# Patient Record
Sex: Female | Born: 2000 | Race: Black or African American | Hispanic: No | Marital: Single | Smoking: Never smoker
Health system: Southern US, Community
[De-identification: ages and names within clinical notes are randomized; demographics above are authoritative.]

## PROBLEM LIST (undated history)

## (undated) DIAGNOSIS — A6000 Herpesviral infection of urogenital system, unspecified: Secondary | ICD-10-CM

## (undated) DIAGNOSIS — F32A Depression, unspecified: Secondary | ICD-10-CM

## (undated) DIAGNOSIS — I10 Essential (primary) hypertension: Secondary | ICD-10-CM

## (undated) DIAGNOSIS — F411 Generalized anxiety disorder: Secondary | ICD-10-CM

## (undated) DIAGNOSIS — J45909 Unspecified asthma, uncomplicated: Secondary | ICD-10-CM

## (undated) DIAGNOSIS — J302 Other seasonal allergic rhinitis: Secondary | ICD-10-CM

## (undated) HISTORY — PX: BREAST REDUCTION SURGERY: SHX8

## (undated) HISTORY — DX: Depression, unspecified: F32.A

## (undated) HISTORY — DX: Herpesviral infection of urogenital system, unspecified: A60.00

## (undated) HISTORY — PX: ABCESS DRAINAGE: SHX399

## (undated) HISTORY — DX: Generalized anxiety disorder: F41.1

---
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
## 2021-03-17 ENCOUNTER — Other Ambulatory Visit: Payer: Self-pay

## 2021-03-17 ENCOUNTER — Emergency Department (HOSPITAL_BASED_OUTPATIENT_CLINIC_OR_DEPARTMENT_OTHER)
Admission: EM | Admit: 2021-03-17 | Discharge: 2021-03-17 | Disposition: A | Discharge status: Home / Self Care | Payer: Medicaid Other | Attending: Emergency Medicine | Admitting: Emergency Medicine

## 2021-03-17 ENCOUNTER — Encounter (HOSPITAL_BASED_OUTPATIENT_CLINIC_OR_DEPARTMENT_OTHER): Payer: Self-pay

## 2021-03-17 DIAGNOSIS — Z5321 Procedure and treatment not carried out due to patient leaving prior to being seen by health care provider: Secondary | ICD-10-CM | POA: Diagnosis not present

## 2021-03-17 DIAGNOSIS — Z20822 Contact with and (suspected) exposure to covid-19: Secondary | ICD-10-CM | POA: Insufficient documentation

## 2021-03-17 DIAGNOSIS — J101 Influenza due to other identified influenza virus with other respiratory manifestations: Secondary | ICD-10-CM | POA: Insufficient documentation

## 2021-03-17 LAB — RESP PANEL BY RT-PCR (FLU A&B, COVID) ARPGX2
Influenza A by PCR: POSITIVE — AB
Influenza B by PCR: NEGATIVE
SARS Coronavirus 2 by RT PCR: NEGATIVE

## 2021-03-17 NOTE — ED Notes (Signed)
Pt called x 1 w/o response.  

## 2021-03-17 NOTE — ED Triage Notes (Signed)
Pt c/o URI sx started last night with +flu exposure-NAD-steady gait

## 2021-03-17 NOTE — ED Notes (Signed)
Pt called x2 w/o relief.

## 2021-05-24 IMAGING — DX DG CHEST 1V PORT
1 series · 1 of 1 positions shown · non-contrast
Comparison: None.

CLINICAL DATA: Shortness of breath and tachycardia

EXAM:
PORTABLE CHEST 1 VIEW

[chest ap]
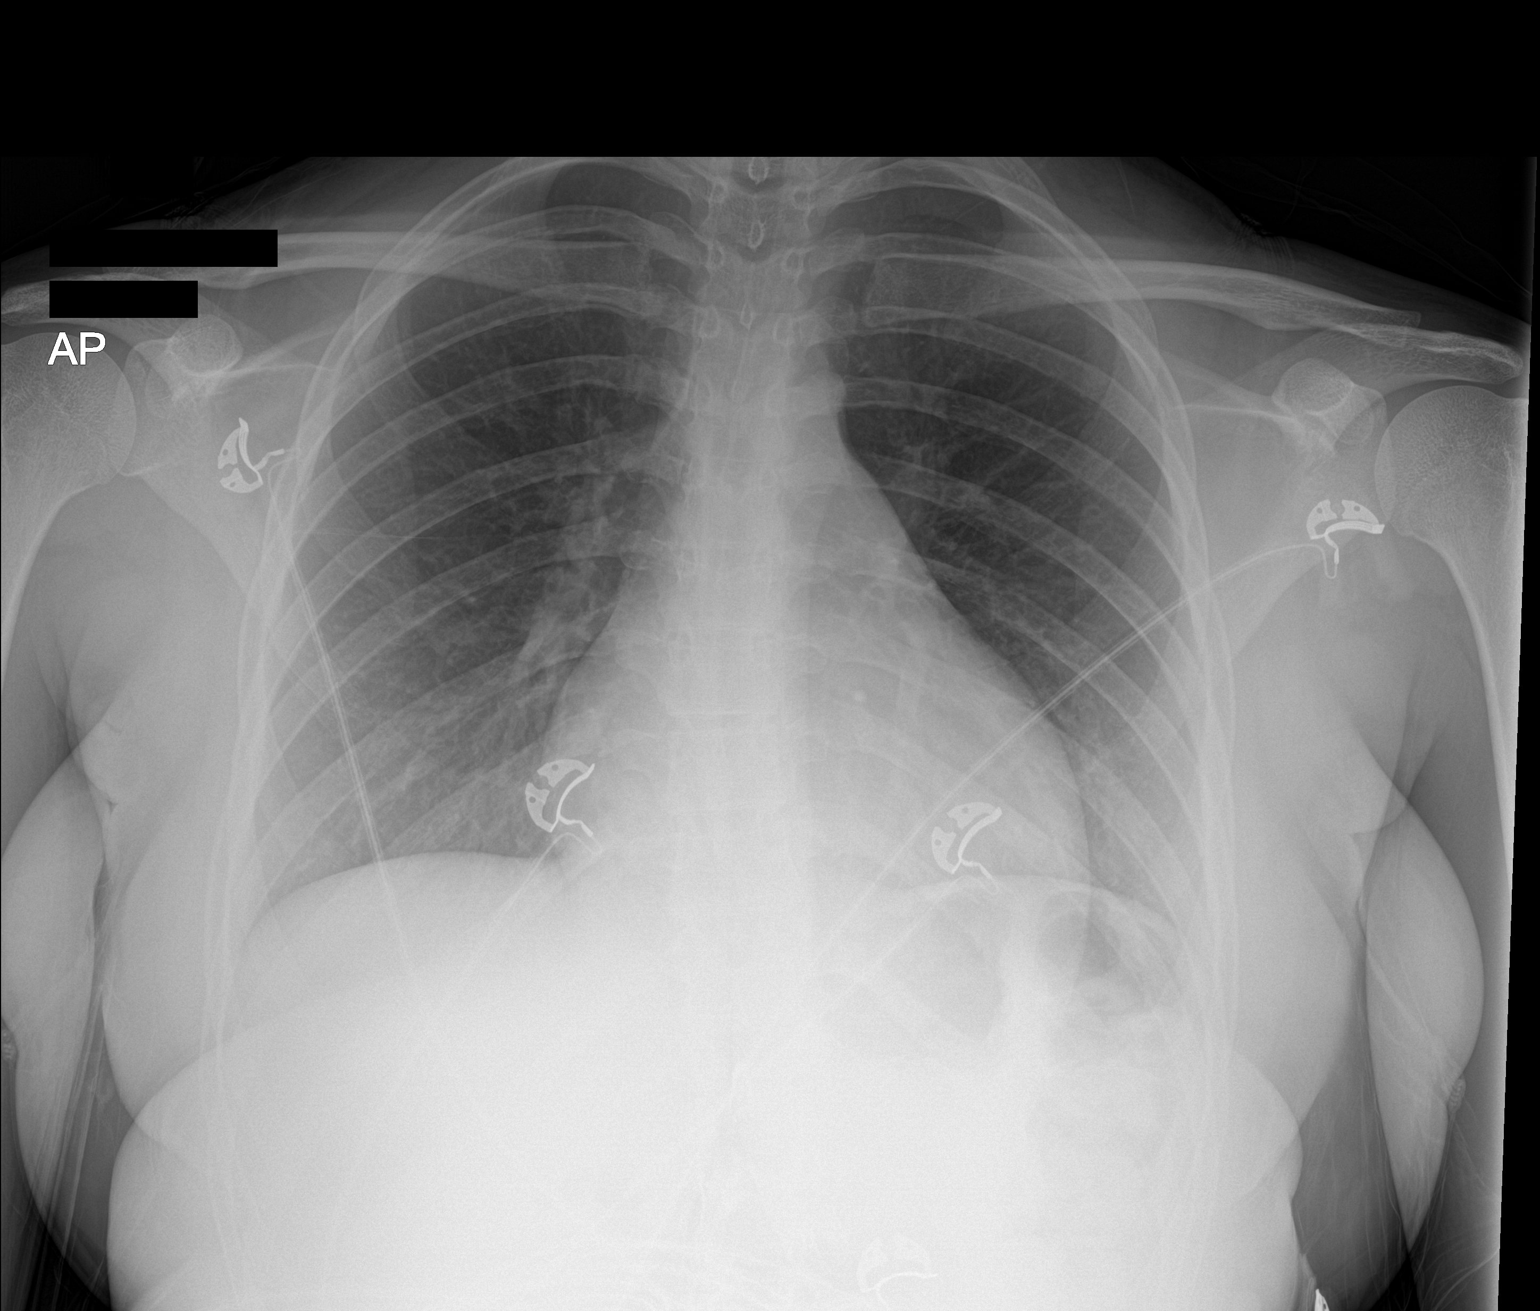

[1 of 1 positions shown; findings below may reference images not displayed]

FINDINGS: The heart size and mediastinal contours are within normal limits.
Both lungs are clear. The visualized skeletal structures are
unremarkable.
IMPRESSION: No active disease.

## 2021-08-07 ENCOUNTER — Other Ambulatory Visit: Payer: Self-pay

## 2021-08-07 ENCOUNTER — Emergency Department (HOSPITAL_BASED_OUTPATIENT_CLINIC_OR_DEPARTMENT_OTHER)
Admission: EM | Admit: 2021-08-07 | Discharge: 2021-08-07 | Disposition: A | Discharge status: Home / Self Care | Payer: Medicaid Other | Attending: Emergency Medicine | Admitting: Emergency Medicine

## 2021-08-07 DIAGNOSIS — H10022 Other mucopurulent conjunctivitis, left eye: Secondary | ICD-10-CM | POA: Diagnosis not present

## 2021-08-07 DIAGNOSIS — H5712 Ocular pain, left eye: Secondary | ICD-10-CM | POA: Diagnosis present

## 2021-08-07 NOTE — ED Provider Notes (Signed)
?[facility] EMERGENCY DEPARTMENT ?Provider Note ? ? ?CSN: [id number] ?Arrival date & time: 08/07/21  2114 ? ?  ? ?History ? ?Chief Complaint  ?Patient presents with  ? Eye Problem  ? ? ?Margaret Harvey is a 21 y.o. female who presents to the ED for evaluation of left eye pain, redness and drainage.  Patient was seen 3 days ago at urgent care for similar symptoms and was prescribed amoxicillin p.o. in addition to Polytrim otic drops she.  She notes that her ear pain has improved, however her eye redness and pain has not.  She notes that it has become increasingly water and blurry.  She describes the pain as achy instead of burning or sharp.  Denies mucopurulent discharge.  No fevers or chills. ? ? ?Eye Problem ? ?  ? ?Home Medications ?Prior to Admission medications   ?Medication Sig Start Date End Date Taking? Authorizing Provider  ?adapalene (DIFFERIN) 0.1 % gel After washing, apply a thin film topically to affected area(s) once daily 09/19/16   [provider]  ?albuterol (PROVENTIL HFA;VENTOLIN HFA) 108 (90 Base) MCG/ACT inhaler Inhale into the lungs. 09/19/16   [provider]  ?cetirizine (ZYRTEC) 10 MG tablet Take 10 mg by mouth. 09/19/16   [provider]  ?diphenhydrAMINE (BENADRYL) 25 MG tablet Take 1 tablet (25 mg total) by mouth every 6 (six) hours as needed. 05/24/19   Mesner, Barbara Cower, MD  ?EPINEPHrine (EPIPEN 2-PAK) 0.3 mg/0.3 mL IJ SOAJ injection Inject 0.3 mLs (0.3 mg total) into the muscle as needed for anaphylaxis. 05/24/19   Mesner, Barbara Cower, MD  ?fluticasone (FLONASE) 50 MCG/ACT nasal spray Place into the nose. 09/19/16   [provider]  ?Olopatadine HCl 0.2 % SOLN Apply to eye. 09/19/16   [provider]  ?promethazine (PHENERGAN) 12.5 MG tablet 1-2 tablets as needed every 6 hours for headache/nausea 11/22/16   Margurite Auerbach, MD  ?   ? ?Allergies    ?Patient has no known allergies.   ? ?Review of Systems   ?Review of Systems ? ?Physical Exam ?Updated Vital  Signs ?BP 132/82 (BP Location: Right Arm)   Pulse 82   Temp 98.4 ?F (36.9 ?C) (Oral)   Resp 18   LMP 07/11/2021   SpO2 99%  ?Physical Exam ?Vitals and nursing note reviewed.  ?Constitutional:   ?   General: She is not in acute distress. ?   Appearance: She is not ill-appearing.  ?HENT:  ?   Head: Atraumatic.  ?Eyes:  ?   Extraocular Movements: Extraocular movements intact.  ?   Conjunctiva/sclera: Conjunctivae normal.  ?   Comments: Left eye with mild episcleral injection without hemorrhage or exudate.  No pain with EOM movement ? ?Visual fields with 2020 on right eye 20/50 on left eye, complaining of blurriness  ?Cardiovascular:  ?   Rate and Rhythm: Normal rate and regular rhythm.  ?   Pulses: Normal pulses.  ?   Heart sounds: No murmur heard. ?Pulmonary:  ?   Effort: Pulmonary effort is normal. No respiratory distress.  ?   Breath sounds: Normal breath sounds.  ?Abdominal:  ?   General: Abdomen is flat. There is no distension.  ?   Palpations: Abdomen is soft.  ?   Tenderness: There is no abdominal tenderness.  ?Musculoskeletal:     ?   General: Normal range of motion.  ?   Cervical back: Normal range of motion.  ?Skin: ?   General: Skin is warm and dry.  ?  Capillary Refill: Capillary refill takes less than 2 seconds.  ?Neurological:  ?   General: No focal deficit present.  ?   Mental Status: She is alert.  ?Psychiatric:     ?   Mood and Affect: Mood normal.  ? ? ?ED Results / Procedures / Treatments   ?Labs ?(all labs ordered are listed, but only abnormal results are displayed) ?Labs Reviewed - No data to display ? ?EKG ?None ? ?Radiology ?No results found. ? ?Procedures ?Procedures  ? ? ?Medications Ordered in ED ?Medications - No data to display ? ?ED Course/ Medical Decision Making/ A&P ?  ?                        ?Medical Decision Making ? ?History:  ?Per HPI ?Social determinants of health: None ? ?Initial impression: ? ?This patient presents to the ED for concern of Left eye pain, this involves an  extensive number of treatment options, and is a complaint that carries with it a high risk of complications and morbidity.   The emergent differential diagnosis for acute eye pain includes, but is not limited to ocular ischemia, optic neuritis, temporal arteritis, Sinusitis, neuralgia, Migraine, Acute angle closure glaucoma, eye trauma, uveitis, iritis, corneal abrasion/ulceration, and photokeratitis. ? ? ?ED Course: ?21 year old female with persistent left eye redness, wateriness and blurriness despite Polytrim eyedrops and systemic amoxicillin for 2 days.  On physical exam, patient is not having pain with EOM movement.  No periorbital swelling or edema that would be concerning for orbital cellulitis.  No murky purulent discharge that would suggest bacterial conjunctivitis.  Since this is affecting only 1 eye, additionally less concerned for viral or allergic conjunctivitis.  Patient had decreased visual acuity of the left eye.  Discussed my case with Dr. Stevie Kern who recommends that patient follow-up with ophthalmology as she is already on both systemic and topical antibiotics. ? ?Disposition: ? ?After consideration of the diagnostic results, physical exam, history and the patients response to treatment feel that the patent would benefit from discharge with ophthalmology follow-up.   ?Other conjunctivitis of left eye: Patient encouraged to continue with her antibiotics and to call ophthalmology office in the morning.  Referral was provided.  Restrict return precautions were discussed.  All questions were asked and answered and she was discharged home in good condition. ? ? ? ?Final Clinical Impression(s) / ED Diagnoses ?Final diagnoses:  ?Other mucopurulent conjunctivitis of left eye  ? ? ?Rx / DC Orders ?ED Discharge Orders   ? ?      Ordered  ?  Visual acuity screening       ? 08/07/21 2147  ? ?  ?  ? ?  ? ? ?  ?Janell Quiet, [place] ?08/08/21 0014 ? ?  ?Milagros Loll, MD ?08/10/21 1324 ? ?

## 2021-08-07 NOTE — ED Triage Notes (Signed)
Pt presents with L eye pain and drainage. Was seen at [facility] already on Thurs and given antibiotic drops. She says that it is not getting better and vision is blurry. R eye is at baseline.  ?

## 2021-08-07 NOTE — Discharge Instructions (Addendum)
I have given you a referral to an ophthalmologist that I want you to call in the morning to schedule an appointment with.  Continue taking the amoxicillin and the antibiotic eyedrops as prescribed.  I would also refrain from using redness relieving eyedrops as this can cause further irritation.  You can try lubricating drops such as Systane or refresh that you can get over-the-counter and use this several times throughout the day to help with pain and irritation.  You are on the correct medications for your symptoms, and at this point you may need to be evaluated by an eye doctor if your symptoms or not improving. ?

## 2021-08-17 ENCOUNTER — Emergency Department (HOSPITAL_BASED_OUTPATIENT_CLINIC_OR_DEPARTMENT_OTHER)
Admission: EM | Admit: 2021-08-17 | Discharge: 2021-08-18 | Disposition: A | Discharge status: Home / Self Care | Payer: Medicaid Other | Attending: Emergency Medicine | Admitting: Emergency Medicine

## 2021-08-17 ENCOUNTER — Encounter (HOSPITAL_BASED_OUTPATIENT_CLINIC_OR_DEPARTMENT_OTHER): Payer: Self-pay

## 2021-08-17 ENCOUNTER — Other Ambulatory Visit: Payer: Self-pay

## 2021-08-17 DIAGNOSIS — N926 Irregular menstruation, unspecified: Secondary | ICD-10-CM | POA: Insufficient documentation

## 2021-08-17 LAB — PREGNANCY, URINE: Preg Test, Ur: NEGATIVE

## 2021-08-17 NOTE — ED Triage Notes (Signed)
Pt arrives with c/o a late menstrual cycle by 6 days. Per pt, she has taken a pregnancy test, but they have been negative. Pt endorse cramps and nausea.   ?

## 2021-08-18 NOTE — ED Provider Notes (Signed)
?[facility] EMERGENCY DEPARTMENT ?Provider Note ? ? ?CSN: [id number] ?Arrival date & time: 08/17/21  2338 ? ?  ? ?History ? ?Chief Complaint  ?Patient presents with  ? Possible Pregnancy  ? ? ?Margaret Harvey is a 21 y.o. female. ? ?Patient is a 21 year old female presenting with complaints of a missed menstrual period.  She was due to have her period 10 days ago, however it never came.  Patient is denying any abdominal pain, discharge, fever, or other complaint. ? ?The history is provided by the patient.  ? ?  ? ?Home Medications ?Prior to Admission medications   ?Medication Sig Start Date End Date Taking? Authorizing Provider  ?adapalene (DIFFERIN) 0.1 % gel After washing, apply a thin film topically to affected area(s) once daily 09/19/16   [provider]  ?albuterol (PROVENTIL HFA;VENTOLIN HFA) 108 (90 Base) MCG/ACT inhaler Inhale into the lungs. 09/19/16   [provider]  ?cetirizine (ZYRTEC) 10 MG tablet Take 10 mg by mouth. 09/19/16   [provider]  ?diphenhydrAMINE (BENADRYL) 25 MG tablet Take 1 tablet (25 mg total) by mouth every 6 (six) hours as needed. 05/24/19   Mesner, Barbara Cower, MD  ?EPINEPHrine (EPIPEN 2-PAK) 0.3 mg/0.3 mL IJ SOAJ injection Inject 0.3 mLs (0.3 mg total) into the muscle as needed for anaphylaxis. 05/24/19   Mesner, Barbara Cower, MD  ?fluticasone (FLONASE) 50 MCG/ACT nasal spray Place into the nose. 09/19/16   [provider]  ?Olopatadine HCl 0.2 % SOLN Apply to eye. 09/19/16   [provider]  ?promethazine (PHENERGAN) 12.5 MG tablet 1-2 tablets as needed every 6 hours for headache/nausea 11/22/16   Margurite Auerbach, MD  ?   ? ?Allergies    ?Patient has no known allergies.   ? ?Review of Systems   ?Review of Systems  ?All other systems reviewed and are negative. ? ?Physical Exam ?Updated Vital Signs ?BP (!) 141/92 (BP Location: Left Arm)   Pulse 93   Temp 98 ?F (36.7 ?C)   Resp 16   Wt 63.5 kg   LMP  (LMP Unknown)   SpO2 100%   BMI 26.45 kg/m?   ?Physical Exam ?Vitals and nursing note reviewed.  ?Constitutional:   ?   Appearance: Normal appearance.  ?HENT:  ?   Head: Normocephalic and atraumatic.  ?Pulmonary:  ?   Effort: Pulmonary effort is normal.  ?Abdominal:  ?   General: Abdomen is flat. There is no distension.  ?   Tenderness: There is no abdominal tenderness. There is no right CVA tenderness, left CVA tenderness, guarding or rebound.  ?Skin: ?   General: Skin is warm and dry.  ?Neurological:  ?   Mental Status: She is alert.  ? ? ?ED Results / Procedures / Treatments   ?Labs ?(all labs ordered are listed, but only abnormal results are displayed) ?Labs Reviewed  ?PREGNANCY, URINE  ? ? ?EKG ?None ? ?Radiology ?No results found. ? ?Procedures ?Procedures  ? ? ?Medications Ordered in ED ?Medications - No data to display ? ?ED Course/ Medical Decision Making/ A&P ? ?Pregnancy test is negative.  Patient's abdominal exam is benign.  She has no other complaints.  I see no indication for any work-up.  Patient is to follow-up with her primary doctor/GYN if her period does not come soon.  To return as needed for any problems. ? ?Final Clinical Impression(s) / ED Diagnoses ?Final diagnoses:  ?None  ? ? ?Rx / DC Orders ?ED Discharge Orders   ? ? None  ? ?  ? ? ?  ?  Geoffery Lyons, MD ?08/18/21 0009 ? ?

## 2021-08-18 NOTE — Discharge Instructions (Signed)
Follow-up with your GYN/primary doctor if you experience additional issues. ?

## 2021-12-17 IMAGING — DX DG CHEST 1V PORT
1 series · 1 of 1 positions shown · non-contrast
Comparison: None.

CLINICAL DATA: [id number]

EXAM:
PORTABLE CHEST 1 VIEW

[chest ap]
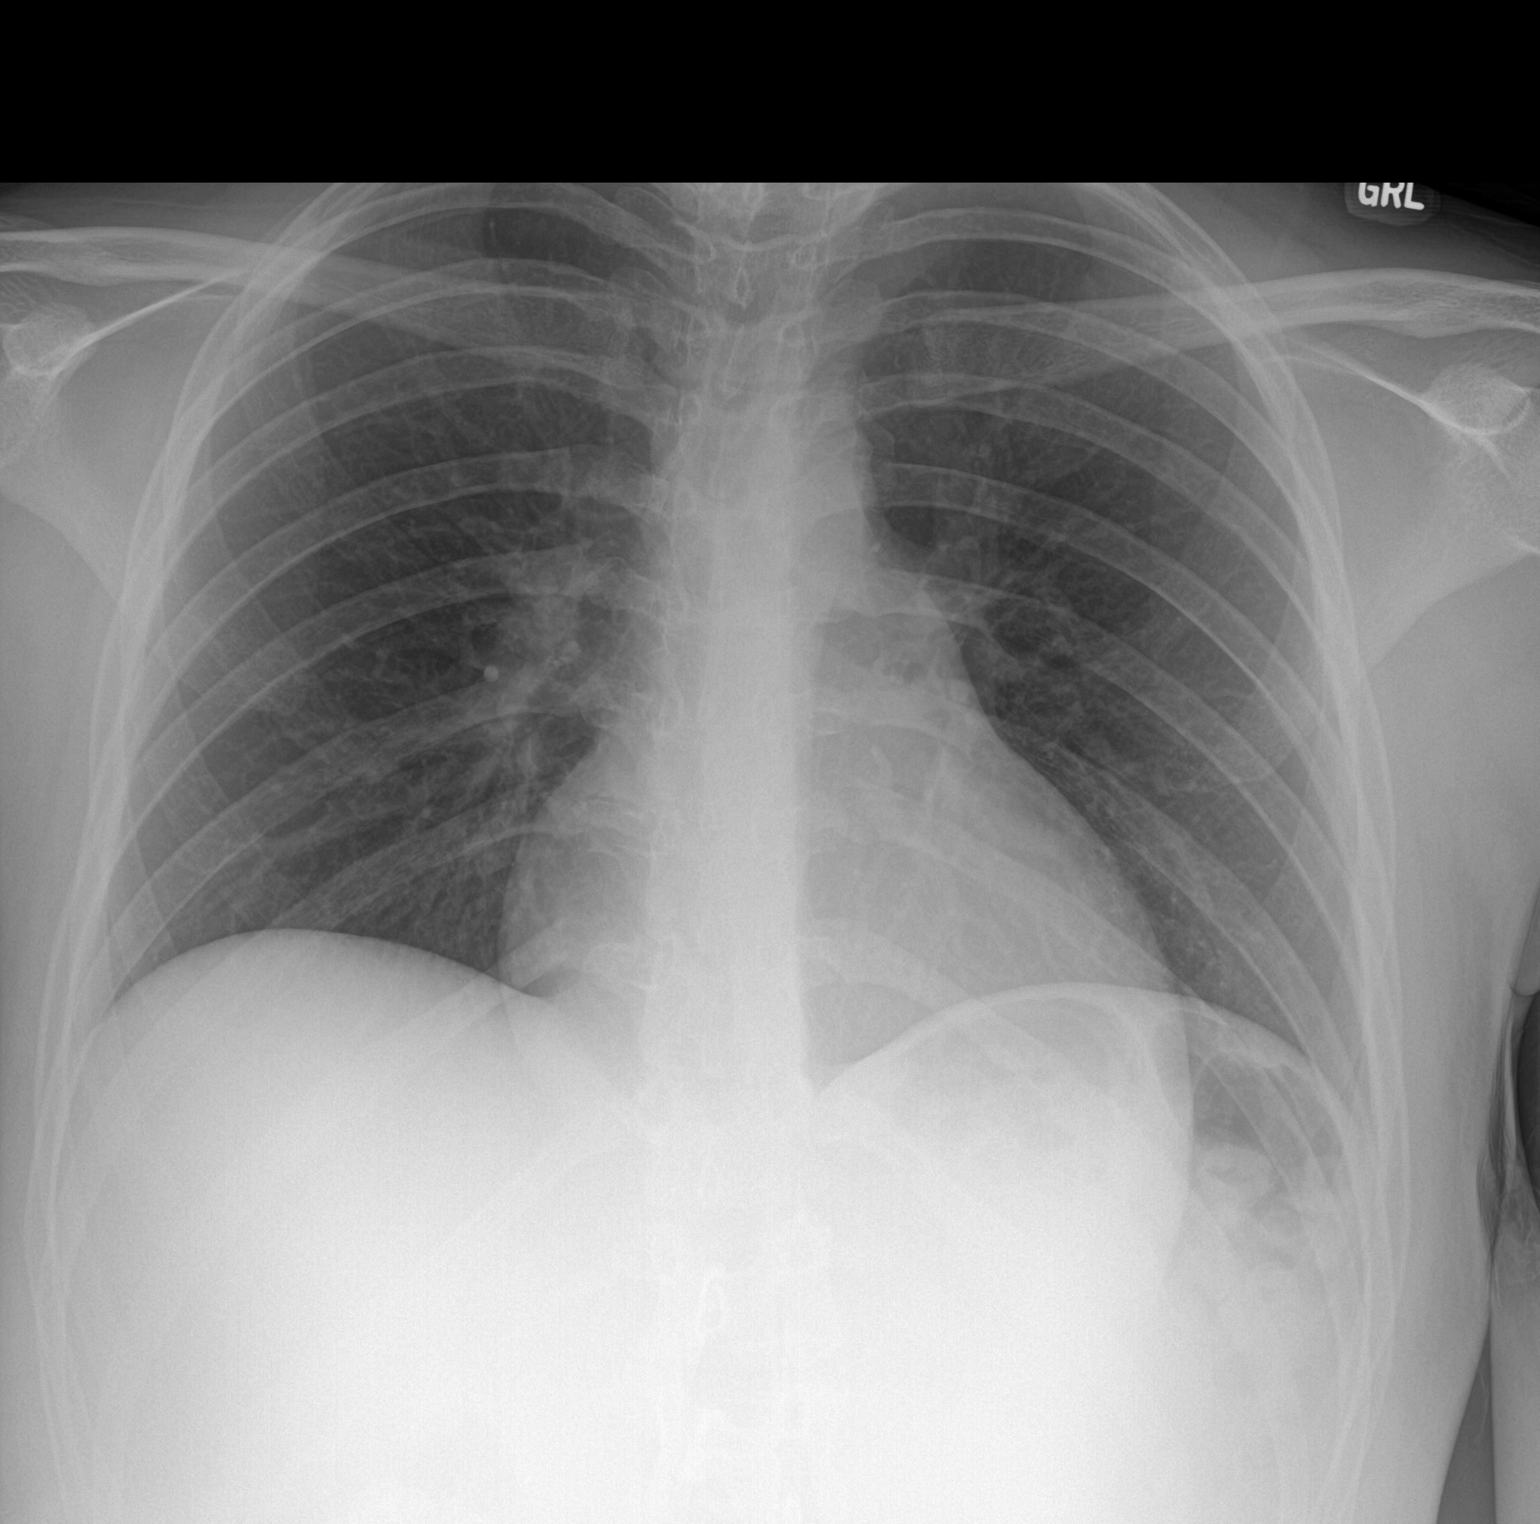

[1 of 1 positions shown; findings below may reference images not displayed]

FINDINGS: The heart size and mediastinal contours are within normal limits.
Both lungs are clear. The visualized skeletal structures are
unremarkable.
IMPRESSION: No active disease.

## 2022-04-03 DIAGNOSIS — A6004 Herpesviral vulvovaginitis: Secondary | ICD-10-CM | POA: Insufficient documentation

## 2022-05-15 HISTORY — PX: DILATION AND EVACUATION: SHX1459

## 2022-05-28 DIAGNOSIS — F411 Generalized anxiety disorder: Secondary | ICD-10-CM | POA: Insufficient documentation

## 2022-05-28 DIAGNOSIS — F332 Major depressive disorder, recurrent severe without psychotic features: Secondary | ICD-10-CM | POA: Insufficient documentation

## 2022-08-16 ENCOUNTER — Encounter: Payer: Medicaid Other | Admitting: Advanced Practice Midwife

## 2022-08-16 NOTE — Progress Notes (Deleted)
   Subjective:     Margaret Harvey is a 22 y.o. female here at [facility] *** for a routine exam.  Current complaints: ***.  Personal health questionnaire reviewed: {yes/no:9010}.  Do you have a primary care provider? *** Do you feel safe at home? ***    Health Maintenance Due  Topic Date Due   COVID-19 Vaccine (1) Never done   HPV VACCINES (1 - 2-dose series) Never done   HIV Screening  Never done   Hepatitis C Screening  Never done   DTaP/Tdap/Td (1 - Tdap) Never done   PAP-Cervical Cytology Screening  Never done   PAP SMEAR-Modifier  Never done     Risk factors for chronic health problems: Smoking: Alchohol/how much: Pt BMI: There is no height or weight on file to calculate BMI.   Gynecologic History No LMP recorded. Contraception: {method:5051} Last Pap: ***. Results were: {norm/abn:16337} Last mammogram: ***. Results were: {norm/abn:16337}  Obstetric History OB History  No obstetric history on file.     {Common ambulatory SmartLinks:19316}  Review of Systems {ros; complete:30496}    Objective:   VS reviewed, nursing note reviewed,  Constitutional: well developed, well nourished, no distress HEENT: normocephalic, thyroid without enlargement or mass HEART: RRR, no murmurs rubs/gallops RESP: clear and equal to auscultation bilaterally in all lobes  Breast Exam:  ***Deferred with low risks and shared decision making, discussed recommendation to start mammogram between 40-50 yo/ exam performed: right breast normal without mass, skin or nipple changes or axillary nodes, left breast normal without mass, skin or nipple changes or axillary nodes Abdomen: soft Neuro: alert and oriented x 3 Skin: warm, dry Psych: affect normal Pelvic exam: ***Deferred/ Performed: Cervix pink, visually closed, without lesion, scant white creamy discharge, vaginal walls and external genitalia normal Bimanual exam: Cervix 0/long/high, firm, anterior, neg CMT, uterus nontender, nonenlarged,  adnexa without tenderness, enlargement, or mass        Assessment/Plan:   There are no diagnoses linked to this encounter.     No follow-ups on file.   Fatima Blank, CNM 8:28 AM

## 2022-08-23 ENCOUNTER — Encounter: Payer: Self-pay | Admitting: Obstetrics and Gynecology

## 2022-08-23 ENCOUNTER — Ambulatory Visit: Payer: Medicaid Other | Admitting: Obstetrics and Gynecology

## 2022-08-23 VITALS — BP 124/81 | HR 66 | Ht 61.0 in | Wt 167.0 lb

## 2022-08-23 DIAGNOSIS — Z3009 Encounter for other general counseling and advice on contraception: Secondary | ICD-10-CM

## 2022-08-23 DIAGNOSIS — Z23 Encounter for immunization: Secondary | ICD-10-CM | POA: Diagnosis not present

## 2022-08-23 DIAGNOSIS — Z7689 Persons encountering health services in other specified circumstances: Secondary | ICD-10-CM

## 2022-08-23 LAB — CYTOLOGY - PAP

## 2022-08-23 LAB — GC/CHLAMYDIA PROBE AMP
Chlamydia, DNA Probe: NEGATIVE
Neisseria Gonorrhea: NEGATIVE

## 2022-08-23 LAB — RPR: RPR: NONREACTIVE

## 2022-08-23 LAB — HCV ANTIBODY: HCV Ab: NONREACTIVE

## 2022-08-23 LAB — HEPATITIS B SURFACE ANTIGEN: HBsAg Screen: NONREACTIVE

## 2022-08-23 LAB — HIV 1/2 AB - SCREENING TEST - 4TH GEN: HIV: NONREACTIVE

## 2022-08-23 NOTE — Patient Instructions (Signed)
It was nice meeting you today! You are up to date on your Ob/Gyn care. Please let us know if you have any questions.

## 2022-08-23 NOTE — Progress Notes (Signed)
NEW GYNECOLOGY VISIT  Subjective:  Margaret Harvey is a 22 y.o. G1P0010 with LMP 08/16/22 presenting to establish care.  Underwent a surgical pregnancy termination in January. Per patient, procedure was uncomplicated. She had minimal bleeding after the procedure. Her menstrual cycles returned 8 weeks after the procedure and have been normal. She has no other complaints or concerns today.   She is currently sexually active with female partner is does not use contraception. She is not interested in pregnancy in the near future. She has tried the pill, Depo & IUD but did not like any of them.   Pt requested that her chart is marked confidential as she has a family member that works within our system.  I personally reviewed the following: 04/24/22: Pap NILM 04/24/22: GC/CT negative 03/23/22 HIV NR 03/23/22 HCV NR 03/23/22 RPR NR 03/23/22 HBSAg NR  Past Medical History:  Diagnosis Date   Genital herpes    Seasonal allergies    Past Surgical History:  Procedure Laterality Date   ABCESS DRAINAGE     BREAST REDUCTION SURGERY     DILATION AND EVACUATION  05/2022   Current Outpatient Medications on File Prior to Visit  Medication Sig Dispense Refill   cetirizine (ZYRTEC) 10 MG tablet Take 10 mg by mouth.     diphenhydrAMINE (BENADRYL) 25 MG tablet Take 1 tablet (25 mg total) by mouth every 6 (six) hours as needed. 30 tablet 0   EPINEPHrine (EPIPEN 2-PAK) 0.3 mg/0.3 mL IJ SOAJ injection Inject 0.3 mLs (0.3 mg total) into the muscle as needed for anaphylaxis. 1 each 1   fluticasone (FLONASE) 50 MCG/ACT nasal spray Place into the nose.     Olopatadine HCl 0.2 % SOLN Apply to eye.     No current facility-administered medications on file prior to visit.   Allergies  Allergen Reactions   Other Anaphylaxis and Other (See Comments)    Almonds   OB History     Gravida  1   Para      Term      Preterm      AB  1   Living         SAB  0   IAB  1   Ectopic      Multiple       Live Births             Social History   Socioeconomic History   Marital status: Single    Spouse name: Not on file   Number of children: Not on file   Years of education: Not on file   Highest education level: Not on file  Occupational History   Not on file  Tobacco Use   Smoking status: Never   Smokeless tobacco: Never  Vaping Use   Vaping Use: Some days  Substance and Sexual Activity   Alcohol use: Yes    Comment: occ   Drug use: Yes    Types: Marijuana   Sexual activity: Yes    Partners: Male    Birth control/protection: None  Other Topics Concern   Not on file  Social History Narrative   Margaret Harvey will be entering the 11th grade at [employer]; she does well in school. She lives with her mother and sister. She enjoys watching TV, dancing, and running.       No therapy or counseling.       Head injury: January hit head on the side of the brick house.    Social  Determinants of Health   Financial Resource Strain: Not on file  Food Insecurity: Not on file  Transportation Needs: Not on file  Physical Activity: Not on file  Stress: Not on file  Social Connections: Not on file  Intimate Partner Violence: Not on file   Objective:   Vitals:   08/23/22 0903  BP: 124/81  Pulse: 66  Weight: 167 lb (75.8 kg)  Height: 5\' 1"  (1.549 m)   General:  Alert, oriented and cooperative. Patient is in no acute distress.  Skin: Skin is warm and dry. No rash noted.   Cardiovascular: Normal heart rate noted  Respiratory: Normal respiratory effort, no problems with respiration noted  Abdomen: Soft, non-tender, non-distended    Assessment and Plan:  Margaret Harvey is a 22 y.o. presenting to establish care  Encounter to establish care - Pt is currently up to date on screening (pap, GC/CT, HCV, & HIV) and denies any new partners - She is having normal menstrual cycles so reassurance provided regarding completion of surgical termination. Offered UPT for confirmation but she  declines and feels comfortable with plan. - We discussed that she can reach out with new questions/concerns. Recommend annual exam within 1 year  2. Encounter for general counseling and advice on contraceptive management Not currently using contraception. Discussed non-Rx options like condoms, withdrawal, rhythm/timing cycles, and Plan B prn  3. Need for vaccination  Records reviewed and updated in our system. Has received Gardasil x 3 doses. Discussed that she is due for covid and Tdap vaccine. She is considering.  Return in about 1 year (around 08/23/2023) for annual exam.  No future appointments.  Lennart Pall, MD

## 2022-11-08 ENCOUNTER — Emergency Department (HOSPITAL_BASED_OUTPATIENT_CLINIC_OR_DEPARTMENT_OTHER): Payer: Medicaid Other

## 2022-11-08 ENCOUNTER — Emergency Department (HOSPITAL_BASED_OUTPATIENT_CLINIC_OR_DEPARTMENT_OTHER)
Admission: EM | Admit: 2022-11-08 | Discharge: 2022-11-08 | Disposition: A | Discharge status: Home / Self Care | Payer: Medicaid Other | Attending: Emergency Medicine | Admitting: Emergency Medicine

## 2022-11-08 ENCOUNTER — Encounter (HOSPITAL_BASED_OUTPATIENT_CLINIC_OR_DEPARTMENT_OTHER): Payer: Self-pay | Admitting: Urology

## 2022-11-08 DIAGNOSIS — N83202 Unspecified ovarian cyst, left side: Secondary | ICD-10-CM | POA: Diagnosis not present

## 2022-11-08 DIAGNOSIS — R102 Pelvic and perineal pain: Secondary | ICD-10-CM | POA: Diagnosis present

## 2022-11-08 LAB — URINALYSIS, ROUTINE W REFLEX MICROSCOPIC
Bilirubin Urine: NEGATIVE
Glucose, UA: NEGATIVE mg/dL
Hgb urine dipstick: NEGATIVE
Ketones, ur: NEGATIVE mg/dL
Leukocytes,Ua: NEGATIVE
Nitrite: NEGATIVE
Protein, ur: NEGATIVE mg/dL
Specific Gravity, Urine: 1.02 (ref 1.005–1.030)
pH: 7.5 (ref 5.0–8.0)

## 2022-11-08 LAB — PREGNANCY, URINE: Preg Test, Ur: NEGATIVE

## 2022-11-08 NOTE — ED Provider Notes (Signed)
[facility] Provider Note   CSN: [id number] Arrival date & time: 11/08/22  1215     History  Chief Complaint  Patient presents with   Pelvic Pain   Possible Pregnancy    Margaret Harvey is a 22 y.o. female.   Pelvic Pain  Possible Pregnancy   22 year old female presents emergency department with complaints of pelvic pain for the past 4 to 5 days.  Patient states that she is concerned about pregnancy as her menstrual cycle is 1 week late.  Patient states that the end of her last menstrual cycle was around 5/30.  Patient reports pain in uterine area as well as just left of uterine.  Describes pain as cramping type sensation.  States the pain has been responding well to Tylenol in the outpatient setting.  Denies any fever, chills, nausea, vomiting, urinary symptoms, vaginal symptoms, change in bowel habits.  Presents emergency department due to concerns for pregnancy despite 2 negative at-home pregnancy test.  Patient with G1, P0, A1  Past medical history significant for seasonal allergies, genital herpes  Home Medications Prior to Admission medications   Medication Sig Start Date End Date Taking? Authorizing Provider  cetirizine (ZYRTEC) 10 MG tablet Take 10 mg by mouth. 09/19/16   [provider]  diphenhydrAMINE (BENADRYL) 25 MG tablet Take 1 tablet (25 mg total) by mouth every 6 (six) hours as needed. 05/24/19   Mesner, Barbara Cower, MD  EPINEPHrine (EPIPEN 2-PAK) 0.3 mg/0.3 mL IJ SOAJ injection Inject 0.3 mLs (0.3 mg total) into the muscle as needed for anaphylaxis. 05/24/19   Mesner, Barbara Cower, MD  fluticasone (FLONASE) 50 MCG/ACT nasal spray Place into the nose. 09/19/16   [provider]  Olopatadine HCl 0.2 % SOLN Apply to eye. 09/19/16   [provider]      Allergies    Other    Review of Systems   Review of Systems  Genitourinary:  Positive for pelvic pain.  All other systems reviewed and are negative.   Physical  Exam Updated Vital Signs BP 139/85 (BP Location: Right Arm)   Pulse 74   Temp 98 F (36.7 C) (Oral)   Resp 16   Ht 5\' 1"  (1.549 m)   Wt 75.8 kg   LMP 10/04/2022   SpO2 100%   BMI 31.57 kg/m  Physical Exam Vitals and nursing note reviewed.  Constitutional:      General: She is not in acute distress.    Appearance: She is well-developed.  HENT:     Head: Normocephalic and atraumatic.  Eyes:     Conjunctiva/sclera: Conjunctivae normal.  Cardiovascular:     Rate and Rhythm: Normal rate and regular rhythm.     Heart sounds: No murmur heard. Pulmonary:     Effort: Pulmonary effort is normal. No respiratory distress.     Breath sounds: Normal breath sounds.  Abdominal:     Palpations: Abdomen is soft.     Tenderness: There is abdominal tenderness. There is no right CVA tenderness, left CVA tenderness, guarding or rebound. Negative signs include Murphy's sign and McBurney's sign.     Comments: Mild suprapubic tenderness as well as appreciable left-sided adnexal tenderness.  Musculoskeletal:        General: No swelling.     Cervical back: Neck supple.  Skin:    General: Skin is warm and dry.     Capillary Refill: Capillary refill takes less than 2 seconds.  Neurological:     Mental  Status: She is alert.  Psychiatric:        Mood and Affect: Mood normal.     ED Results / Procedures / Treatments   Labs (all labs ordered are listed, but only abnormal results are displayed) Labs Reviewed  URINALYSIS, ROUTINE W REFLEX MICROSCOPIC - Abnormal; Notable for the following components:      Result Value   Color, Urine STRAW (*)    All other components within normal limits  PREGNANCY, URINE    EKG None  Radiology US PELVIC COMPLETE W TRANSVAGINAL AND TORSION R/O  Result Date: 11/08/2022 CLINICAL DATA:  Pain. EXAM: TRANSABDOMINAL AND TRANSVAGINAL ULTRASOUND OF PELVIS DOPPLER ULTRASOUND OF OVARIES TECHNIQUE: Both transabdominal and transvaginal ultrasound examinations of the  pelvis were performed. Transabdominal technique was performed for global imaging of the pelvis including uterus, ovaries, adnexal regions, and pelvic cul-de-sac. It was necessary to proceed with endovaginal exam following the transabdominal exam to visualize the adnexa and endometrium. Color and duplex Doppler ultrasound was utilized to evaluate blood flow to the ovaries. COMPARISON:  None Available. FINDINGS: Uterus Measurements: 7.7 x 3.9 x 5.0 cm. = volume: 78.5 mL. No fibroids or other mass visualized. Endometrium Thickness: 4 mm.  No focal abnormality visualized. Right ovary Measurements: 4.2 x 2.1 x 1.8 cm = volume: 8.5 mL. Normal appearance/no adnexal mass. Left ovary Measurements: 5.3 x 3.4 x 4.7 cm = volume: 45 mL. Associated complex cystic area identified in the ovary measuring 4.1 x 2.7 x 4.3 cm. Possible hemorrhagic. Preserved blood flow to left ovary on Doppler. Pulsed Doppler evaluation of both ovaries demonstrates normal low-resistance arterial and venous waveforms. Other findings No abnormal free fluid. IMPRESSION: 4.3 cm complex left ovarian cystic lesion. Possibly hemorrhagic cyst but indeterminate. Recommend follow-up US in 3-6 months to confirm resolution. Note: This recommendation does not apply to premenarchal patients or to those with increased risk (genetic, family history, elevated tumor markers or other high-risk factors) of ovarian cancer. Reference: Radiology 2019 Nov; 293(2):359-371. Electronically Signed   By: Karen Kays M.D.   On: 11/08/2022 15:36    Procedures Procedures    Medications Ordered in ED Medications - No data to display  ED Course/ Medical Decision Making/ A&P Clinical Course as of 11/08/22 1722  Wed Nov 08, 2022  1355 5/30 lmp Cramping suprabupic pain 4 days [CR]    Clinical Course User Index [CR] Peter Garter, PA                             Medical Decision Making Amount and/or Complexity of Data Reviewed Labs: ordered. Radiology:  ordered.   This patient presents to the ED for concern of pelvic pain, this involves an extensive number of treatment options, and is a complaint that carries with it a high risk of complications and morbidity.  The differential diagnosis includes ectopic pregnancy, ovarian torsion, PID, tubo-ovarian abscess, cystitis, ovarian cyst, menstrual cycle   Co morbidities that complicate the patient evaluation  See HPI   Additional history obtained:  Additional history obtained from EMR External records from outside source obtained and reviewed including hospital records   Lab Tests:  I Ordered, and personally interpreted labs.  The pertinent results include: Your pregnancy negative.  UA without abnormality   Imaging Studies ordered:  I ordered imaging studies including pelvic ultrasound I independently visualized and interpreted imaging which showed complex appearing left-sided adnexal ovarian cyst measuring 4.3 cm most likely hemorrhagic I agree with  the radiologist interpretation   Cardiac Monitoring: / EKG:  The patient was maintained on a cardiac monitor.  I personally viewed and interpreted the cardiac monitored which showed an underlying rhythm of: Sinus rhythm   Consultations Obtained:  N/a   Problem List / ED Course / Critical interventions / Medication management  Left ovarian cyst Reevaluation of the patient showed that the patient stayed the same I have reviewed the patients home medicines and have made adjustments as needed   Social Determinants of Health:  Denies tobacco, illicit drug use   Test / Admission - Considered:  Left ovarian cyst Vitals signs  within normal range and stable throughout visit. Laboratory/imaging studies significant for: See above 22 year old female presents emergency department with complaints of pelvic pain for the past 3 to 4 days.  Patient's pain on exam more left adnexal than midline and suprapubic region so subsequently  ultrasound was obtained for ovarian torsion rule out.  Patient found with evidence of left-sided ovarian cyst most likely hemorrhagic in nature most likely causing patient's symptoms of pelvic pain lateralized to the left.  No evidence of ovarian torsion on ultrasound exam.  Will recommend continued treatment of pain at home with Tylenol/Motrin as well as follow-up in the outpatient setting with OB/GYN for reassessment.  Treatment plan discussed at length with patient and she acknowledged understanding and was agreeable to said plan.  Patient overall well-appearing, febrile no acute distress. Worrisome signs and symptoms were discussed with the patient, and the patient acknowledged understanding to return to the ED if noticed. Patient was stable upon discharge.          Final Clinical Impression(s) / ED Diagnoses Final diagnoses:  Left ovarian cyst    Rx / DC Orders ED Discharge Orders     None         Peter Garter, PA 11/08/22 1722    Virgina Norfolk, DO 11/09/22 [phone]

## 2022-11-08 NOTE — Discharge Instructions (Signed)
As discussed, you do have evidence of left-sided ovarian cyst most likely causing your pain.  Continue take Tylenol/Motrin as needed for pain.  Recommend follow-up with OB/GYN for reassessment of your symptoms/following of said cyst.  Please do not hesitate to return to emergency department for worrisome signs and symptoms we discussed become apparent.

## 2022-11-08 NOTE — ED Triage Notes (Signed)
Pt states pelvic pain x 1 week and states period is a week late  Had abortion in January  Denies any vaginal discharge.  States pain with urination  G-1, P-0,A-1

## 2022-11-09 ENCOUNTER — Ambulatory Visit: Payer: Medicaid Other | Admitting: Obstetrics and Gynecology

## 2022-11-09 DIAGNOSIS — R109 Unspecified abdominal pain: Secondary | ICD-10-CM

## 2022-11-30 ENCOUNTER — Ambulatory Visit: Payer: Medicaid Other | Admitting: Obstetrics and Gynecology

## 2022-11-30 ENCOUNTER — Encounter: Payer: Self-pay | Admitting: Obstetrics and Gynecology

## 2022-11-30 VITALS — BP 129/81 | HR 92 | Resp 16 | Ht 61.0 in | Wt 163.0 lb

## 2022-11-30 DIAGNOSIS — N83202 Unspecified ovarian cyst, left side: Secondary | ICD-10-CM

## 2022-11-30 NOTE — Progress Notes (Signed)
   RETURN GYNECOLOGY VISIT  Subjective:  Margaret Harvey is a 22 y.o. G1P0010 with LMP 11/13/22 presenting for ED follow up of ovarian cyst  Went to ED on 6/26 with 4-5d of LLQ/suprapubic cramping pelvic pain and late period. UPT negative, w/u revealed indeterminate 4cm L adnexal complex cystic lesion. She was recommended to follow up as outpatient.   Today, pt reports resolution of her pain. Got her period a few days later. No other complaints.   I personally reviewed the following: - ED note by Dr. Dicky Doe PA on 11/08/22 - UA 11/08/22 negative LE/nitrite - UPT 11/08/22 negative - Pelvic US 11/08/22 4.1 x 2.7 x 4.3cm complex cystic area in the ovary, possible hemorrhagic cyst with preserved blood flow to ovary on Doppler. Recommended f/u US in 3-6 months  Objective:   Vitals:   11/30/22 1603  BP: 129/81  Pulse: 92  Resp: 16  Weight: 163 lb (73.9 kg)  Height: 5\' 1"  (1.549 m)    General:  Alert, oriented and cooperative. Patient is in no acute distress.  Skin: Skin is warm and dry. No rash noted.   Cardiovascular: Normal heart rate noted  Respiratory: Normal respiratory effort, no problems with respiration noted  Abdomen: Soft, non-tender, non-distended   Pelvic: NEFG.   Exam performed in the presence of a chaperone  Assessment and Plan:  Margaret Harvey is a 22 y.o. with L ovarian cyst  1. Cyst of left ovary Asymptomatic Repeat US in 3-6 months  - US PELVIC COMPLETE WITH TRANSVAGINAL; Future  Lennart Pall, MD

## 2023-01-05 ENCOUNTER — Other Ambulatory Visit: Payer: Self-pay

## 2023-01-05 ENCOUNTER — Emergency Department (HOSPITAL_BASED_OUTPATIENT_CLINIC_OR_DEPARTMENT_OTHER)
Admission: EM | Admit: 2023-01-05 | Discharge: 2023-01-06 | Disposition: A | Discharge status: Home / Self Care | Payer: Medicaid Other | Attending: Emergency Medicine | Admitting: Emergency Medicine

## 2023-01-05 ENCOUNTER — Encounter (HOSPITAL_BASED_OUTPATIENT_CLINIC_OR_DEPARTMENT_OTHER): Payer: Self-pay | Admitting: Emergency Medicine

## 2023-01-05 DIAGNOSIS — M79645 Pain in left finger(s): Secondary | ICD-10-CM | POA: Insufficient documentation

## 2023-01-05 NOTE — ED Triage Notes (Signed)
PtPOV c/o left middle finger swelling and painful since this morning.  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 No known injury.

## 2023-01-06 MED ORDER — IBUPROFEN 400 MG PO TABS
400.0000 mg | ORAL_TABLET | Freq: Once | ORAL | Status: AC
  Administered 2023-01-06: 400 mg via ORAL
  Filled 2023-01-06: qty 1

## 2023-01-06 MED ORDER — AMOXICILLIN-POT CLAVULANATE 875-125 MG PO TABS: 1.0000 | ORAL_TABLET | Freq: Two times a day (BID) | ORAL | 0 refills | Status: DC

## 2023-01-06 NOTE — ED Provider Notes (Signed)
[place] [facility] Provider Note   CSN: [id number] Arrival date & time: 01/05/23  2313     History  Chief Complaint  Patient presents with   Hand Pain    Margaret Harvey is a 22 y.o. female.  The history is provided by the patient.  Hand Pain  Margaret Harvey is a 22 y.o. female who presents to the Emergency Department complaining of hand pain.  Presents to the emergency department for evaluation of pain to her left third digit.  No reported injuries but she does work [employer], [employer].  She did have artificial nails that were removed several days ago.  No associated fevers, chest pain, difficulty breathing.  No known injuries to this hand.  Pain is located near the radial aspect of the nailbed but also throughout the entire distal digit.  Pain is worse with moving her hand.  She is right-hand dominant.  No known medical problems.     Home Medications Prior to Admission medications   Medication Sig Start Date End Date Taking? Authorizing Provider  amoxicillin-clavulanate (AUGMENTIN) 875-125 MG tablet Take 1 tablet by mouth every 12 (twelve) hours. 01/06/23  Yes Tilden Fossa, MD  cetirizine (ZYRTEC) 10 MG tablet Take 10 mg by mouth. 09/19/16   [provider]  diphenhydrAMINE (BENADRYL) 25 MG tablet Take 1 tablet (25 mg total) by mouth every 6 (six) hours as needed. 05/24/19   Mesner, Barbara Cower, MD  EPINEPHrine (EPIPEN 2-PAK) 0.3 mg/0.3 mL IJ SOAJ injection Inject 0.3 mLs (0.3 mg total) into the muscle as needed for anaphylaxis. 05/24/19   Mesner, Barbara Cower, MD  fluticasone (FLONASE) 50 MCG/ACT nasal spray Place into the nose. 09/19/16   [provider]  Olopatadine HCl 0.2 % SOLN Apply to eye. 09/19/16   [provider]      Allergies    Other    Review of Systems   Review of Systems  All other systems reviewed and are negative.   Physical Exam Updated Vital Signs BP (!) 150/100 (BP Location: Left Arm)   Pulse 86   Temp 98 F  (36.7 C) (Oral)   Resp 16   Ht 5\' 2"  (1.575 m)   Wt 71.7 kg   LMP 12/11/2022   SpO2 100%   BMI 28.90 kg/m  Physical Exam Vitals and nursing note reviewed.  Constitutional:      Appearance: Normal appearance.  HENT:     Head: Normocephalic and atraumatic.  Cardiovascular:     Rate and Rhythm: Normal rate and regular rhythm.  Pulmonary:     Effort: No respiratory distress.  Musculoskeletal:     Comments: 2+ left radial pulse.  There is soft tissue swelling and tenderness throughout the distal aspect of the right third digit.  This involves the dorsal and volar surfaces.  There is no focal fluid collection or evidence of paronychia.  She is able to flex and extend the digit at the DIP, PIP, MCP.    Skin:    General: Skin is warm and dry.  Neurological:     Mental Status: She is alert.     ED Results / Procedures / Treatments   Labs (all labs ordered are listed, but only abnormal results are displayed) Labs Reviewed - No data to display  EKG None  Radiology No results found.  Procedures Procedures    Medications Ordered in ED Medications  ibuprofen (ADVIL) tablet 400 mg (has no administration in time range)    ED Course/  Medical Decision Making/ A&P                                 Medical Decision Making Risk Prescription drug management.   Patient here for evaluation of pain to the left third digit, no reported trauma.  On examination she does have soft tissue swelling.  There is no definite paronychia or felon on examination.  No evidence of flexor tenosynovitis.  No reported injuries.  History and exam is not consistent with septic arthritis, osteomyelitis.  Discussed with patient unclear if this is early infection versus inflammation secondary to overuse.  Discussed NSAIDs.  Will start antibiotics.  Discussed warm soaks.  Discussed outpatient follow-up and return precautions.       Final Clinical Impression(s) / ED Diagnoses Final diagnoses:  Finger  pain, left    Rx / DC Orders ED Discharge Orders          Ordered    amoxicillin-clavulanate (AUGMENTIN) 875-125 MG tablet  Every 12 hours        01/06/23 0007              Tilden Fossa, MD 01/06/23 [phone]

## 2023-01-06 NOTE — ED Notes (Signed)
Reviewed AVS with patient, patient expressed understanding of directions, denies further questions at this time. 

## 2023-01-10 ENCOUNTER — Encounter: Payer: Self-pay | Admitting: Obstetrics and Gynecology

## 2023-01-10 ENCOUNTER — Telehealth: Payer: Self-pay | Admitting: *Deleted

## 2023-01-10 NOTE — Telephone Encounter (Signed)
Returned call from 2:06 PM. Left patient a message to call and schedule.

## 2023-01-11 ENCOUNTER — Ambulatory Visit (INDEPENDENT_AMBULATORY_CARE_PROVIDER_SITE_OTHER): Payer: Medicaid Other | Admitting: Family Medicine

## 2023-01-11 ENCOUNTER — Encounter: Payer: Self-pay | Admitting: Family Medicine

## 2023-01-11 VITALS — BP 117/83 | HR 106 | Temp 98.0°F | Ht 62.0 in | Wt 158.0 lb

## 2023-01-11 DIAGNOSIS — Z7689 Persons encountering health services in other specified circumstances: Secondary | ICD-10-CM

## 2023-01-11 DIAGNOSIS — L0390 Cellulitis, unspecified: Secondary | ICD-10-CM | POA: Diagnosis not present

## 2023-01-11 MED ORDER — CEPHALEXIN 500 MG PO CAPS: 500.0000 mg | ORAL_CAPSULE | Freq: Two times a day (BID) | ORAL | 0 refills | Status: DC

## 2023-01-11 NOTE — Progress Notes (Signed)
New Patient Office Visit  Subjective    Patient ID: Margaret Harvey, female    DOB: Aug 03, 2000  Age: 22 y.o. MRN: [id number]  CC:  Chief Complaint  Patient presents with   Establish Care    Hx of breast reduction pt has red spot under breast that's painful 1 week    HPI Margaret Harvey presents to establish care with this practice. She is new to me. History of breast reduction in 2021. No surgical complications.  Reports red spot under breast that is painful x 1 week.   Has primary care provider that has been giving allergy shots. Unable to see PCP until October. Presents today for assessment of right breast "spot".   Has appointment scheduled with Dr. Berton Lan if needed after follow-up on 9/4.    Outpatient Encounter Medications as of 01/11/2023  Medication Sig   cephALEXin (KEFLEX) 500 MG capsule Take 1 capsule (500 mg total) by mouth 2 (two) times daily.   cetirizine (ZYRTEC) 10 MG tablet Take 10 mg by mouth.   diphenhydrAMINE (BENADRYL) 25 MG tablet Take 1 tablet (25 mg total) by mouth every 6 (six) hours as needed.   EPINEPHrine (EPIPEN 2-PAK) 0.3 mg/0.3 mL IJ SOAJ injection Inject 0.3 mLs (0.3 mg total) into the muscle as needed for anaphylaxis.   fluticasone (FLONASE) 50 MCG/ACT nasal spray Place into the nose.   hydrocortisone 2.5 % cream Apply 1 Application topically 2 (two) times daily.   Olopatadine HCl 0.2 % SOLN Apply to eye.   ondansetron (ZOFRAN-ODT) 4 MG disintegrating tablet Take 4 mg by mouth every 8 (eight) hours as needed.   [DISCONTINUED] amoxicillin-clavulanate (AUGMENTIN) 875-125 MG tablet Take 1 tablet by mouth every 12 (twelve) hours. (Patient not taking: Reported on 01/11/2023)   No facility-administered encounter medications on file as of 01/11/2023.    Past Medical History:  Diagnosis Date   Depression    GAD (generalized anxiety disorder)    Genital herpes    Seasonal allergies     Past Surgical History:  Procedure Laterality Date   ABCESS  DRAINAGE     BREAST REDUCTION SURGERY     DILATION AND EVACUATION  05/2022    Family History  Problem Relation Age of Onset   Sudden death Neg Hx    Hypertension Neg Hx    Hyperlipidemia Neg Hx    Heart attack Neg Hx    Diabetes Neg Hx    Migraines Neg Hx    Seizures Neg Hx    Depression Neg Hx    Anxiety disorder Neg Hx    ADD / ADHD Neg Hx    Autism Neg Hx    Bipolar disorder Neg Hx    Schizophrenia Neg Hx     Social History   Socioeconomic History   Marital status: Single    Spouse name: Not on file   Number of children: Not on file   Years of education: Not on file   Highest education level: Not on file  Occupational History   Not on file  Tobacco Use   Smoking status: Never   Smokeless tobacco: Never  Vaping Use   Vaping status: Some Days  Substance and Sexual Activity   Alcohol use: Yes    Comment: occ   Drug use: Yes    Types: Marijuana   Sexual activity: Yes    Partners: Male    Birth control/protection: None  Other Topics Concern   Not on file  Social History Narrative  Margaret Harvey will be entering the 11th grade at [employer]; she does well in school. She lives with her mother and sister. She enjoys watching TV, dancing, and running.       No therapy or counseling.       Head injury: January hit head on the side of the brick house.    Social Determinants of Health   Financial Resource Strain: Not on file  Food Insecurity: Not on file  Transportation Needs: Not on file  Physical Activity: Not on file  Stress: Not on file  Social Connections: Unknown (11/07/2022)   Received from [facility], [facility]   Social Network    Social Network: Not on file  Intimate Partner Violence: Not At Risk (11/07/2022)   Received from [facility], [facility]   HITS    Over the last 12 months how often did your partner physically hurt you?: 1    Over the last 12 months how often did your partner insult you or talk down to you?: 1    Over the last 12  months how often did your partner threaten you with physical harm?: 1    Over the last 12 months how often did your partner scream or curse at you?: 1    Review of Systems  Constitutional:  Negative for chills and fever.  Skin:        Redness under right breast.   Psychiatric/Behavioral:  Negative for suicidal ideas.         Objective    BP 117/83   Pulse (!) 106   Temp 98 F (36.7 C) (Oral)   Ht 5\' 2"  (1.575 m)   Wt 158 lb (71.7 kg)   LMP 12/11/2022   SpO2 99%   BMI 28.90 kg/m   Physical Exam Vitals and nursing note reviewed. Exam conducted with a chaperone present.  Constitutional:      General: She is not in acute distress.    Appearance: Normal appearance.  Pulmonary:     Effort: Pulmonary effort is normal.  Chest:       Comments: Firm erythematous area under right breast, warm to touch. Superficial in nature. No breast symptoms reported. Skin:    General: Skin is warm and dry.  Neurological:     General: No focal deficit present.     Mental Status: She is alert. Mental status is at baseline.  Psychiatric:        Mood and Affect: Mood normal.        Behavior: Behavior normal.        Thought Content: Thought content normal.        Judgment: Judgment normal.       Flowsheet Row Office Visit from 01/11/2023 in [facility] Total Score 11     No thoughts of self-harm. Will continue to monitor anxiety at future visits. Not currently on treatment.    Assessment & Plan:   Problem List Items Addressed This Visit     Cellulitis    Erythematous, firm raised area under right breast. Superficial in nature. Unsure of insect bite. Warm to touch. Keflex 500 mg BID x 14 days. Will follow-up on 9/4 if not significantly improved. No fever or chills. Will decide at follow-up if Dr. Berton Lan appointment is needed.       Relevant Medications   cephALEXin (KEFLEX) 500 MG capsule   Establishing care with new doctor, encounter for -  Primary  Agrees with  plan of care discussed.  Questions answered.   Return in about 6 days (around 01/17/2023) for cellulits.   Novella Olive, FNP

## 2023-01-11 NOTE — Assessment & Plan Note (Addendum)
Erythematous, firm raised area under right breast. Superficial in nature. Unsure of insect bite. Warm to touch. Keflex 500 mg BID x 14 days. Will follow-up on 9/4 if not significantly improved. No fever or chills. Will decide at follow-up if Dr. Berton Lan appointment is needed.

## 2023-01-11 NOTE — Patient Instructions (Signed)
You were prescribed an antibiotic today. Please complete the full course.  Acetaminophen or ibuprofen for fever according to package instructions, do not exceed recommended doses.  Adequate fluids to avoid dehydration. Lots of rest while you are recovering.  Follow-up if your symptoms do not improve.     

## 2023-01-17 ENCOUNTER — Ambulatory Visit: Payer: Medicaid Other | Admitting: Family Medicine

## 2023-01-17 ENCOUNTER — Encounter: Payer: Self-pay | Admitting: Family Medicine

## 2023-01-17 VITALS — BP 122/76 | HR 98 | Temp 98.0°F | Resp 18 | Ht 62.0 in | Wt 158.5 lb

## 2023-01-17 DIAGNOSIS — L0390 Cellulitis, unspecified: Secondary | ICD-10-CM

## 2023-01-17 NOTE — Progress Notes (Signed)
   Established Patient Office Visit  Subjective   Patient ID: Denyel Pabla, female    DOB: 19-Feb-2001  Age: 22 y.o. MRN: [id number]  Chief Complaint  Patient presents with   Medical Management of Chronic Issues    Patient is here for a 6 day follow up for cellulitis     HPI Presents for follow-up for cellulitis on right breast. Started on Keflex 500 mg BID on 01/11/23. Reports symptoms are improved. Taking antibiotic as prescribed, however, she took dose this morning on empty stomach and vomited it up. She will take dose this afternoon with food.  No fever or chills.   Review of Systems  Constitutional:  Negative for chills and fever.      Objective:     BP 122/76   Pulse 98   Temp 98 F (36.7 C) (Oral)   Resp 18   Ht 5\' 2"  (1.575 m)   Wt 158 lb 8 oz (71.9 kg)   LMP 12/11/2022   SpO2 100%   BMI 28.99 kg/m    Physical Exam Vitals and nursing note reviewed. Exam conducted with a chaperone present.  Constitutional:      General: She is not in acute distress.    Appearance: Normal appearance.  Pulmonary:     Effort: Pulmonary effort is normal.  Skin:    General: Skin is warm and dry.     Comments: Area under right breast without erythema and no longer firm to touch.   Neurological:     General: No focal deficit present.     Mental Status: She is alert. Mental status is at baseline.  Psychiatric:        Mood and Affect: Mood normal.        Behavior: Behavior normal.        Thought Content: Thought content normal.        Judgment: Judgment normal.      No results found for any visits on 01/17/23.    The ASCVD Risk score (Arnett DK, et al., 2019) failed to calculate for the following reasons:   The 2019 ASCVD risk score is only valid for ages 31 to 73    Assessment & Plan:   Problem List Items Addressed This Visit     Cellulitis - Primary    Taking Keflex 500 mg BID as prescribed. Area under right breast is no longer firm and without erythema. Complete  antibiotic regimen as previously prescribed. If this recurs will consider derm referral. No fever or chills, symptoms have improved.      Agrees with plan of care discussed.  Questions answered.   Return if symptoms worsen or fail to improve.    Novella Olive, FNP

## 2023-01-17 NOTE — Assessment & Plan Note (Signed)
Taking Keflex 500 mg BID as prescribed. Area under right breast is no longer firm and without erythema. Complete antibiotic regimen as previously prescribed. If this recurs will consider derm referral. No fever or chills, symptoms have improved.

## 2023-01-18 ENCOUNTER — Ambulatory Visit: Payer: Medicaid Other | Admitting: Obstetrics and Gynecology

## 2023-02-01 ENCOUNTER — Encounter: Payer: Self-pay | Admitting: Family Medicine

## 2023-02-01 ENCOUNTER — Ambulatory Visit (INDEPENDENT_AMBULATORY_CARE_PROVIDER_SITE_OTHER): Payer: Medicaid Other | Admitting: Family Medicine

## 2023-02-01 VITALS — BP 99/68 | HR 87 | Temp 98.3°F | Resp 18 | Ht 62.0 in | Wt 159.0 lb

## 2023-02-01 DIAGNOSIS — B353 Tinea pedis: Secondary | ICD-10-CM | POA: Insufficient documentation

## 2023-02-01 MED ORDER — KETOCONAZOLE 2 % EX CREA: 1.0000 | TOPICAL_CREAM | Freq: Every day | CUTANEOUS | 0 refills | Status: DC

## 2023-02-01 NOTE — Assessment & Plan Note (Addendum)
Apply ketoconazole to affected areas on right foot once per day as instructed. Cleanse area and dry well before applying. If symptoms do not improve notify provider via My Chart for next steps.

## 2023-02-01 NOTE — Progress Notes (Signed)
Established Patient Office Visit  Subjective   Patient ID: Margaret Harvey, female    DOB: Apr 02, 2001  Age: 22 y.o. MRN: [id number]  Chief Complaint  Patient presents with   Foot Problem    Patient states that] she has developed red itchy bumps on her right foot, and something white in between her pinky toe and the one next to it. She states that she thinks it could possibly be an infection from a recent pedicure    HPI Presents today for right foot "bumps" and white discoloration between right 4th and 5th toe. Has tried hydrocortisone cream which has not resolved symptoms.   Took benadryl for itching, with some relief. Got pedicure on Tuesday and as soon as she put feet in the water the right foot started itching. No symptoms on left foot.   Denies fever or chills.    Review of Systems  Constitutional:  Negative for chills and fever.  Skin:  Positive for itching and rash.      Objective:     BP 99/68   Pulse 87   Temp 98.3 F (36.8 C) (Oral)   Resp 18   Ht 5\' 2"  (1.575 m)   Wt 159 lb (72.1 kg)   LMP 12/11/2022   SpO2 100%   BMI 29.08 kg/m    Physical Exam Vitals and nursing note reviewed.  Constitutional:      General: She is not in acute distress.    Appearance: Normal appearance.  Cardiovascular:     Rate and Rhythm: Normal rate.  Pulmonary:     Effort: Pulmonary effort is normal.  Skin:    General: Skin is warm and dry.     Findings: Erythema and rash present. Rash is papular.          Comments: Three papular erythematous areas on dorsum. White skin discoloration between 4th -5th toes.   Neurological:     General: No focal deficit present.     Mental Status: She is alert. Mental status is at baseline.  Psychiatric:        Mood and Affect: Mood normal.        Behavior: Behavior normal.        Thought Content: Thought content normal.        Judgment: Judgment normal.      No results found for any visits on 02/01/23.    The ASCVD Risk score  (Arnett DK, et al., 2019) failed to calculate for the following reasons:   The 2019 ASCVD risk score is only valid for ages 29 to 12    Assessment & Plan:   Problem List Items Addressed This Visit     Tinea pedis of right foot - Primary    Apply ketoconazole to affected areas on right foot once per day as instructed. Cleanse area and dry well before applying. If symptoms do not improve notify provider via My Chart for next steps.       Relevant Medications   ketoconazole (NIZORAL) 2 % cream  Agrees with plan of care discussed.  Questions answered.   Return in about 3 months (around 04/30/2023) for CPE with labs.  Notify provider if symptoms do not improve with current treatment plan.    Novella Olive, FNP

## 2023-02-01 NOTE — Patient Instructions (Signed)
If symptoms are not improved by next Tuesday or Wednesday, send a My Chart message.

## 2023-03-23 ENCOUNTER — Encounter (HOSPITAL_BASED_OUTPATIENT_CLINIC_OR_DEPARTMENT_OTHER): Payer: Self-pay | Admitting: Emergency Medicine

## 2023-03-23 ENCOUNTER — Other Ambulatory Visit: Payer: Self-pay

## 2023-03-23 ENCOUNTER — Emergency Department (HOSPITAL_BASED_OUTPATIENT_CLINIC_OR_DEPARTMENT_OTHER)
Admission: EM | Admit: 2023-03-23 | Discharge: 2023-03-23 | Disposition: A | Discharge status: Home / Self Care | Payer: Medicaid Other | Attending: Emergency Medicine | Admitting: Emergency Medicine

## 2023-03-23 ENCOUNTER — Emergency Department (HOSPITAL_BASED_OUTPATIENT_CLINIC_OR_DEPARTMENT_OTHER): Payer: Medicaid Other

## 2023-03-23 DIAGNOSIS — S61215A Laceration without foreign body of left ring finger without damage to nail, initial encounter: Secondary | ICD-10-CM | POA: Insufficient documentation

## 2023-03-23 DIAGNOSIS — Y92009 Unspecified place in unspecified non-institutional (private) residence as the place of occurrence of the external cause: Secondary | ICD-10-CM | POA: Insufficient documentation

## 2023-03-23 DIAGNOSIS — W01198A Fall on same level from slipping, tripping and stumbling with subsequent striking against other object, initial encounter: Secondary | ICD-10-CM | POA: Insufficient documentation

## 2023-03-23 DIAGNOSIS — W19XXXA Unspecified fall, initial encounter: Secondary | ICD-10-CM

## 2023-03-23 DIAGNOSIS — S0990XA Unspecified injury of head, initial encounter: Secondary | ICD-10-CM | POA: Diagnosis not present

## 2023-03-23 DIAGNOSIS — S6992XA Unspecified injury of left wrist, hand and finger(s), initial encounter: Secondary | ICD-10-CM | POA: Diagnosis present

## 2023-03-23 MED ORDER — ACETAMINOPHEN 500 MG PO TABS
1000.0000 mg | ORAL_TABLET | Freq: Once | ORAL | Status: AC
  Administered 2023-03-23: 1000 mg via ORAL
  Filled 2023-03-23: qty 2

## 2023-03-23 NOTE — ED Notes (Signed)
Pt soaking hand in normal saline and betadine.

## 2023-03-23 NOTE — ED Triage Notes (Signed)
Pt c/o falling in her house, hitting the right side of her head and a laceration to her left ring finger.

## 2023-03-23 NOTE — ED Provider Notes (Signed)
[facility] Provider Note   CSN: [id number] Arrival date & time: 03/23/23  0325     History  Chief Complaint  Patient presents with   Fall   Laceration    Margaret Harvey is a 22 y.o. female.  The history is provided by the patient.  Fall This is a new problem. The current episode started less than 1 hour ago. The problem occurs rarely. The problem has been resolved. Pertinent negatives include no chest pain, no abdominal pain and no shortness of breath. Nothing aggravates the symptoms. Nothing relieves the symptoms. She has tried nothing for the symptoms. The treatment provided no relief.  Laceration Location:  Finger Finger laceration location:  L ring finger Length:  9mm Laceration depth: superficial. Quality: straight   Bleeding: controlled   Laceration mechanism:  Unable to specify Pain details:    Severity:  No pain Foreign body present:  No foreign bodies Relieved by:  Nothing Worsened by:  Nothing Ineffective treatments:  None tried Tetanus status:  Up to date Associated symptoms: no fever and no numbness        Home Medications Prior to Admission medications   Medication Sig Start Date End Date Taking? Authorizing Provider  cephALEXin (KEFLEX) 500 MG capsule Take 1 capsule (500 mg total) by mouth 2 (two) times daily. 01/11/23   Novella Olive, FNP  cetirizine (ZYRTEC) 10 MG tablet Take 10 mg by mouth. 09/19/16   [provider]  diphenhydrAMINE (BENADRYL) 25 MG tablet Take 1 tablet (25 mg total) by mouth every 6 (six) hours as needed. 05/24/19   Mesner, Barbara Cower, MD  EPINEPHrine (EPIPEN 2-PAK) 0.3 mg/0.3 mL IJ SOAJ injection Inject 0.3 mLs (0.3 mg total) into the muscle as needed for anaphylaxis. 05/24/19   Mesner, Barbara Cower, MD  fluticasone (FLONASE) 50 MCG/ACT nasal spray Place into the nose. 09/19/16   [provider]  hydrocortisone 2.5 % cream Apply 1 Application topically 2 (two) times daily. 12/27/22    [provider]  ketoconazole (NIZORAL) 2 % cream Apply 1 Application topically daily. 02/01/23   Novella Olive, FNP  Olopatadine HCl 0.2 % SOLN Apply to eye. 09/19/16   [provider]  ondansetron (ZOFRAN-ODT) 4 MG disintegrating tablet Take 4 mg by mouth every 8 (eight) hours as needed. 11/06/22   [provider]      Allergies    Other    Review of Systems   Review of Systems  Constitutional:  Negative for fever.  Respiratory:  Negative for shortness of breath.   Cardiovascular:  Negative for chest pain.  Gastrointestinal:  Negative for abdominal pain.  Skin:  Positive for wound.  All other systems reviewed and are negative.   Physical Exam Updated Vital Signs BP (!) 138/101   Pulse (!) 102   Temp 98.4 F (36.9 C)   Resp 18   Ht 5\' 2"  (1.575 m)   Wt 71.2 kg   LMP 03/10/2023   SpO2 99%   BMI 28.72 kg/m  Physical Exam Vitals and nursing note reviewed.  Constitutional:      General: She is not in acute distress.    Appearance: Normal appearance. She is well-developed.  HENT:     Head: Normocephalic and atraumatic.     Right Ear: Tympanic membrane normal.     Left Ear: Tympanic membrane normal.     Nose: Nose normal.  Eyes:     Pupils: Pupils are equal, round, and reactive to  light.  Cardiovascular:     Rate and Rhythm: Normal rate and regular rhythm.     Pulses: Normal pulses.     Heart sounds: Normal heart sounds.  Pulmonary:     Effort: Pulmonary effort is normal. No respiratory distress.     Breath sounds: Normal breath sounds.  Abdominal:     General: Bowel sounds are normal. There is no distension.     Palpations: Abdomen is soft.     Tenderness: There is no abdominal tenderness. There is no guarding or rebound.  Musculoskeletal:        General: Normal range of motion.       Hands:     Cervical back: Normal, normal range of motion and neck supple.     Thoracic back: Normal.     Lumbar back: Normal.  Skin:    General: Skin is  warm and dry.     Capillary Refill: Capillary refill takes less than 2 seconds.     Findings: No erythema or rash.  Neurological:     General: No focal deficit present.     Mental Status: She is alert.     Deep Tendon Reflexes: Reflexes normal.  Psychiatric:        Mood and Affect: Mood normal.     ED Results / Procedures / Treatments   Labs (all labs ordered are listed, but only abnormal results are displayed) Labs Reviewed - No data to display  EKG None  Radiology No results found.  Procedures .Marland KitchenLaceration Repair  Date/Time: 03/23/2023 5:35 AM  Performed by: Cy Blamer, MD Authorized by: Cy Blamer, MD   Consent:    Consent obtained:  Verbal   Consent given by:  Patient   Risks discussed:  Infection, need for additional repair, pain and poor cosmetic result   Alternatives discussed:  No treatment Universal protocol:    Patient identity confirmed:  Arm band Anesthesia:    Anesthesia method:  None Laceration details:    Location:  Finger   Finger location:  L ring finger   Length (cm):  0.9   Laceration depth: .25. Pre-procedure details:    Preparation:  Patient was prepped and draped in usual sterile fashion Exploration:    Imaging outcome: foreign body not noted     Wound extent: fascia not violated, no foreign body and no signs of injury   Treatment:    Area cleansed with:  Povidone-iodine, chlorhexidine and saline   Amount of cleaning:  Extensive Skin repair:    Repair method:  Tissue adhesive Approximation:    Approximation:  Close Repair type:    Repair type:  Simple Post-procedure details:    Dressing:  Open (no dressing)     Medications Ordered in ED Medications  acetaminophen (TYLENOL) tablet 1,000 mg (1,000 mg Oral Given 03/23/23 0343)    ED Course/ Medical Decision Making/ A&P                                 Medical Decision Making Patient fell while walking superficial cut to the tuft of the left ring finger   Amount and/or  Complexity of Data Reviewed External Data Reviewed: notes.    Details: Previous notes reviewed  Radiology: ordered and independent interpretation performed.    Details: Negative Xray of hand and head CT  Risk OTC drugs. Risk Details: Laceration to the tuft is a consistent with depth of a papercut.  I  closed it with dermabond to keep it clean.  No injuries from fall.  Stable for discharge.  Strict return     Final Clinical Impression(s) / ED Diagnoses Final diagnoses:  Fall, initial encounter  Laceration of left ring finger without foreign body without damage to nail, initial encounter   Return for intractable cough, coughing up blood, fevers > 100.4 unrelieved by medication, shortness of breath, intractable vomiting, chest pain, shortness of breath, weakness, numbness, changes in speech, facial asymmetry, abdominal pain, passing out, Inability to tolerate liquids or food, cough, altered mental status or any concerns. No signs of systemic illness or infection. The patient is nontoxic-appearing on exam and vital signs are within normal limits.  I have reviewed the triage vital signs and the nursing notes. Pertinent labs & imaging results that were available during my care of the patient were reviewed by me and considered in my medical decision making (see chart for details). After history, exam, and medical workup I feel the patient has been appropriately medically screened and is safe for discharge home. Pertinent diagnoses were discussed with the patient. Patient was given return precautions. Rx / DC Orders ED Discharge Orders     None         Amadu Schlageter, MD 03/23/23 7829

## 2023-04-16 ENCOUNTER — Other Ambulatory Visit: Payer: Medicaid Other

## 2023-04-16 ENCOUNTER — Ambulatory Visit: Payer: Medicaid Other

## 2023-04-16 DIAGNOSIS — N83202 Unspecified ovarian cyst, left side: Secondary | ICD-10-CM | POA: Diagnosis not present

## 2023-04-30 ENCOUNTER — Encounter: Payer: Medicaid Other | Admitting: Family Medicine

## 2023-05-18 ENCOUNTER — Encounter: Payer: Self-pay | Admitting: Obstetrics and Gynecology

## 2023-05-29 ENCOUNTER — Encounter: Payer: Self-pay | Admitting: Obstetrics and Gynecology

## 2023-05-29 ENCOUNTER — Other Ambulatory Visit: Payer: Medicaid Other

## 2023-05-29 ENCOUNTER — Ambulatory Visit (INDEPENDENT_AMBULATORY_CARE_PROVIDER_SITE_OTHER): Payer: Medicaid Other | Admitting: Obstetrics and Gynecology

## 2023-05-29 VITALS — BP 124/74 | HR 79 | Wt 161.0 lb

## 2023-05-29 DIAGNOSIS — Z3480 Encounter for supervision of other normal pregnancy, unspecified trimester: Secondary | ICD-10-CM | POA: Insufficient documentation

## 2023-05-29 DIAGNOSIS — O99341 Other mental disorders complicating pregnancy, first trimester: Secondary | ICD-10-CM | POA: Diagnosis not present

## 2023-05-29 DIAGNOSIS — Z1339 Encounter for screening examination for other mental health and behavioral disorders: Secondary | ICD-10-CM

## 2023-05-29 DIAGNOSIS — Z3481 Encounter for supervision of other normal pregnancy, first trimester: Secondary | ICD-10-CM

## 2023-05-29 DIAGNOSIS — Z3490 Encounter for supervision of normal pregnancy, unspecified, unspecified trimester: Secondary | ICD-10-CM

## 2023-05-29 DIAGNOSIS — Z3A01 Less than 8 weeks gestation of pregnancy: Secondary | ICD-10-CM

## 2023-05-29 DIAGNOSIS — F411 Generalized anxiety disorder: Secondary | ICD-10-CM

## 2023-05-29 DIAGNOSIS — O0990 Supervision of high risk pregnancy, unspecified, unspecified trimester: Secondary | ICD-10-CM | POA: Insufficient documentation

## 2023-05-29 DIAGNOSIS — Z3A11 11 weeks gestation of pregnancy: Secondary | ICD-10-CM | POA: Diagnosis not present

## 2023-05-29 DIAGNOSIS — G43009 Migraine without aura, not intractable, without status migrainosus: Secondary | ICD-10-CM | POA: Diagnosis not present

## 2023-05-29 DIAGNOSIS — Z3401 Encounter for supervision of normal first pregnancy, first trimester: Secondary | ICD-10-CM

## 2023-05-29 MED ORDER — PROCHLORPERAZINE MALEATE 10 MG PO TABS: 10.0000 mg | ORAL_TABLET | Freq: Four times a day (QID) | ORAL | 1 refills | Status: DC | PRN

## 2023-05-29 MED ORDER — PROPRANOLOL HCL 40 MG PO TABS: 40.0000 mg | ORAL_TABLET | Freq: Every day | ORAL | 1 refills | Status: DC

## 2023-05-29 NOTE — Progress Notes (Signed)
 History:   Rasheen Schewe is a 23 y.o. G2P0010 at [redacted]w[redacted]d by LMP being seen today for her first obstetrical visit.  Her obstetrical history is significant for  herpes, anxiety, migraines.  . Patient does intend to breast feed. Pregnancy history fully reviewed.  Patient reports headache. Has a diagnoses of migraine without aura. Diagnosed as a child. Reports migraines 4 days per week since pregnant. Taking tylneol PM every night which helps.       HISTORY: OB History  Gravida Para Term Preterm AB Living  2 0 0 0 1 0  SAB IAB Ectopic Multiple Live Births  0 1 0 0 0    # Outcome Date GA Lbr Len/2nd Weight Sex Type Anes PTL Lv  2 Current           1 IAB             Last pap smear was done 04/24/22 and was normal  Past Medical History:  Diagnosis Date   Depression    GAD (generalized anxiety disorder)    Genital herpes    Seasonal allergies    Past Surgical History:  Procedure Laterality Date   ABCESS DRAINAGE     BREAST REDUCTION SURGERY     DILATION AND EVACUATION  05/2022   Family History  Problem Relation Age of Onset   Sudden death Neg Hx    Hypertension Neg Hx    Hyperlipidemia Neg Hx    Heart attack Neg Hx    Diabetes Neg Hx    Migraines Neg Hx    Seizures Neg Hx    Depression Neg Hx    Anxiety disorder Neg Hx    ADD / ADHD Neg Hx    Autism Neg Hx    Bipolar disorder Neg Hx    Schizophrenia Neg Hx    Social History   Tobacco Use   Smoking status: Never   Smokeless tobacco: Never  Vaping Use   Vaping status: Some Days  Substance Use Topics   Alcohol use: Yes    Comment: occ   Drug use: Yes    Types: Marijuana   Allergies  Allergen Reactions   Other Anaphylaxis and Other (See Comments)    Almonds   Current Outpatient Medications on File Prior to Visit  Medication Sig Dispense Refill   escitalopram  (LEXAPRO ) 10 MG tablet Take by mouth.     No current facility-administered medications on file prior to visit.    Review of Systems Pertinent  items noted in HPI and remainder of comprehensive ROS otherwise negative.  Indications for ASA therapy (per UpToDate) One of the following: Previous pregnancy with preeclampsia, especially early onset and with an adverse outcome No Multifetal gestation No Chronic hypertension No Type 1 or 2 diabetes mellitus No Chronic kidney disease No Autoimmune disease (antiphospholipid syndrome, systemic lupus erythematosus) No Two or more of the following: Nulliparity Yes Obesity (body mass index >30 kg/m2) Yes Family history of preeclampsia in mother or sister No Age >=35 years Yes Sociodemographic characteristics (African American race, low socioeconomic level) Yes Personal risk factors (eg, previous pregnancy with low birth weight or small for gestational age infant, previous adverse pregnancy outcome [eg, stillbirth], interval >10 years between pregnancies) No  Physical Exam:   Vitals:   05/29/23 1439  BP: 124/74  Pulse: 79  Weight: 161 lb (73 kg)   Fetal Heart Rate (bpm): 111  Uterine size:    Patient informed that the ultrasound is considered a limited obstetric ultrasound  and is not intended to be a complete ultrasound exam.  Patient also informed that the ultrasound is not being completed with the intent of assessing for fetal or placental anomalies or any pelvic abnormalities.  Explained that the purpose of today's ultrasound is to assess for fetal heart rate.  Patient acknowledges the purpose of the exam and the limitations of the study. General: well-developed, well-nourished female in no acute distress  Breasts:  normal appearance, no masses or tenderness bilaterally, exam done in the presence of a chaperone.   Skin: normal coloration and turgor, no rashes  Neurologic: oriented, normal, negative, normal mood  Extremities: normal strength, tone, and muscle mass, ROM of all joints is normal  HEENT PERRLA, extraocular movement intact and sclera clear, anicteric  Neck supple and no  masses  Cardiovascular: regular rate and rhythm  Respiratory:  no respiratory distress, normal breath sounds  Abdomen: soft, non-tender; bowel sounds normal; no masses,  no organomegaly  Pelvic: Deferred     Assessment:    Pregnancy: G2P0010 Patient Active Problem List   Diagnosis Date Noted   Migraine headache without aura 05/29/2023   Supervision of other normal pregnancy, antepartum 05/29/2023   Generalized anxiety disorder 05/28/2022   Severe episode of recurrent major depressive disorder, without psychotic features (HCC) 05/28/2022   Herpes simplex vulvovaginitis 04/03/2022     Plan:    1. Encounter for supervision of normal pregnancy, antepartum, unspecified gravidity (Primary)  - Hemoglobin A1c - US  OB Limited; Future - Pregnancy, Initial Screen - US  MFM OB COMP + 14 WK; Future - Babyscripts Schedule Optimization - Start BASA 81 mg  2. Encounter for supervision of normal first pregnancy in first trimester  - PANORAMA PRENATAL TEST  3. Encounter for supervision of other normal pregnancy, first trimester  - HORIZON Basic Panel  4. Generalized anxiety disorder  Doing well on lexapro    5. Migraine without aura and without status migrainosus, not intractable  Initiate propranolol  40 mg daily for migraine prophylaxis. Rx sent Rx for compazine  as needed PRN Discussed 4000 gram max a day using tylenol  Benadryl  is safe to use as needed  Start OTC magnesium  400 mg PO at bedtime.    Initial labs drawn. Continue prenatal vitamins. Problem list reviewed and updated. Genetic Screening discussed, Panorama and Horizon: requested. Ultrasound discussed; fetal anatomic survey: scheduled. Anticipatory guidance about prenatal visits given including labs, ultrasounds, and testing. Weight gain recommendations per IOM guidelines reviewed: underweight/BMI 18.5 or less > 28 - 40 lbs; normal weight/BMI 18.5 - 24.9 > 25 - 35 lbs; overweight/BMI 25 - 29.9 > 15 - 25 lbs; obese/BMI   30 or more > 11 - 20 lbs. Discussed usage of the Babyscripts app for more information about pregnancy, and to track blood pressures. Also discussed usage of virtual visits as additional source of managing and completing prenatal visits.  Patient was encouraged to use MyChart to review results, send requests, and have questions addressed.   The nature of Center for [facility] Healthcare/Faculty Practice with multiple MDs and Advanced Practice Providers was explained to patient; also emphasized that residents, students are part of our team. Routine obstetric precautions reviewed. Encouraged to seek out care at our office or emergency room [facility] preferred) for urgent and/or emergent concerns. No follow-ups on file.     Joeanna Howdyshell, Delon FERNS, NP Faculty Practice Center for [employer], [facility]

## 2023-05-29 NOTE — Patient Instructions (Signed)
 Headaches:  Pick up magnesium and baby aspirin 81 mg at the pharmacy.  Magnesium 400 mg at bedtime, any magnesium is fine.   It is ok to take magnesium and compazine before bedtime.

## 2023-05-30 DIAGNOSIS — O132 Gestational [pregnancy-induced] hypertension without significant proteinuria, second trimester: Secondary | ICD-10-CM

## 2023-05-31 ENCOUNTER — Other Ambulatory Visit: Payer: Self-pay

## 2023-05-31 DIAGNOSIS — O219 Vomiting of pregnancy, unspecified: Secondary | ICD-10-CM

## 2023-05-31 LAB — PREGNANCY, INITIAL SCREEN
Antibody Screen: NEGATIVE
Basophils Absolute: 0 10*3/uL (ref 0.0–0.2)
Basos: 1 %
Bilirubin, UA: NEGATIVE
Chlamydia trachomatis, NAA: NEGATIVE
EOS (ABSOLUTE): 0.8 10*3/uL — ABNORMAL HIGH (ref 0.0–0.4)
Eos: 12 %
Glucose, UA: NEGATIVE
HCV Ab: NONREACTIVE
HIV Screen 4th Generation wRfx: NONREACTIVE
Hematocrit: 40.6 % (ref 34.0–46.6)
Hemoglobin: 13.3 g/dL (ref 11.1–15.9)
Hepatitis B Surface Ag: NEGATIVE
Immature Grans (Abs): 0 10*3/uL (ref 0.0–0.1)
Immature Granulocytes: 0 %
Leukocytes,UA: NEGATIVE
Lymphocytes Absolute: 3.3 10*3/uL — ABNORMAL HIGH (ref 0.7–3.1)
Lymphs: 49 %
MCH: 26.8 pg (ref 26.6–33.0)
MCHC: 32.8 g/dL (ref 31.5–35.7)
MCV: 82 fL (ref 79–97)
Monocytes Absolute: 0.7 10*3/uL (ref 0.1–0.9)
Monocytes: 11 %
Neisseria Gonorrhoeae by PCR: NEGATIVE
Neutrophils Absolute: 1.8 10*3/uL (ref 1.4–7.0)
Neutrophils: 27 %
Nitrite, UA: NEGATIVE
Platelets: 423 10*3/uL (ref 150–450)
Protein,UA: NEGATIVE
RBC, UA: NEGATIVE
RBC: 4.96 x10E6/uL (ref 3.77–5.28)
RDW: 11.9 % (ref 11.7–15.4)
RPR Ser Ql: NONREACTIVE
Rh Factor: POSITIVE
Rubella Antibodies, IGG: 7.68 {index} (ref >0.99)
Specific Gravity, UA: 1.016 (ref 1.005–1.030)
Urobilinogen, Ur: 0.2 mg/dL (ref 0.2–1.0)
WBC: 6.6 10*3/uL (ref 3.4–10.8)
pH, UA: 6.5 (ref 5.0–7.5)

## 2023-05-31 LAB — HCV INTERPRETATION

## 2023-05-31 LAB — URINE CULTURE, OB REFLEX

## 2023-05-31 LAB — MICROSCOPIC EXAMINATION
Casts: NONE SEEN /[LPF]
RBC, Urine: NONE SEEN /[HPF] (ref 0–2)
WBC, UA: NONE SEEN /[HPF] (ref 0–5)

## 2023-05-31 LAB — HEMOGLOBIN A1C
Est. average glucose Bld gHb Est-mCnc: 111 mg/dL
Hgb A1c MFr Bld: 5.5 % (ref 4.8–5.6)

## 2023-05-31 MED ORDER — PROMETHAZINE HCL 25 MG PO TABS
25.0000 mg | ORAL_TABLET | Freq: Four times a day (QID) | ORAL | 0 refills | Status: DC | PRN
Start: 2023-05-31 — End: 2023-09-24

## 2023-05-31 NOTE — Progress Notes (Signed)
Phenergan sent per protocol for nausea

## 2023-06-25 ENCOUNTER — Other Ambulatory Visit: Payer: Self-pay | Admitting: Family Medicine

## 2023-06-25 ENCOUNTER — Other Ambulatory Visit: Payer: Self-pay

## 2023-06-25 ENCOUNTER — Other Ambulatory Visit (HOSPITAL_COMMUNITY): Payer: Self-pay

## 2023-06-25 MED ORDER — ESCITALOPRAM OXALATE 10 MG PO TABS
10.0000 mg | ORAL_TABLET | Freq: Every day | ORAL | 0 refills | Status: AC
  Filled 2023-06-25 – 2023-11-19 (×4): qty 90, 90d supply, fill #0

## 2023-06-26 ENCOUNTER — Ambulatory Visit: Payer: Medicaid Other | Admitting: Obstetrics and Gynecology

## 2023-06-26 VITALS — BP 125/78 | HR 79 | Wt 167.0 lb

## 2023-06-26 DIAGNOSIS — O99511 Diseases of the respiratory system complicating pregnancy, first trimester: Secondary | ICD-10-CM | POA: Diagnosis not present

## 2023-06-26 DIAGNOSIS — Z3480 Encounter for supervision of other normal pregnancy, unspecified trimester: Secondary | ICD-10-CM

## 2023-06-26 DIAGNOSIS — J45909 Unspecified asthma, uncomplicated: Secondary | ICD-10-CM

## 2023-06-26 MED ORDER — ALBUTEROL SULFATE HFA 108 (90 BASE) MCG/ACT IN AERS: 1.0000 | INHALATION_SPRAY | Freq: Four times a day (QID) | RESPIRATORY_TRACT | 2 refills | Status: DC | PRN

## 2023-06-26 MED ORDER — PULMICORT FLEXHALER 90 MCG/ACT IN AEPB: 1.0000 | INHALATION_SPRAY | Freq: Two times a day (BID) | RESPIRATORY_TRACT | 3 refills | Status: DC

## 2023-06-26 NOTE — Progress Notes (Signed)
   PRENATAL VISIT NOTE  Subjective:  Margaret Harvey is a 23 y.o. G2P0010 at [redacted]w[redacted]d being seen today for ongoing prenatal care.  She is currently monitored for the following issues for this low-risk pregnancy and has Generalized anxiety disorder; Herpes simplex vulvovaginitis; Severe episode of recurrent major depressive disorder, without psychotic features (HCC); Migraine headache without aura; and Supervision of other normal pregnancy, antepartum on their problem list.  Patient reports  constipation, continued headaches, and shortness of breath .  Contractions: Not present. Vag. Bleeding: None.  Movement: Absent. Denies leaking of fluid.   The following portions of the patient's history were reviewed and updated as appropriate: allergies, current medications, past family history, past medical history, past social history, past surgical history and problem list.   Objective:   Vitals:   06/26/23 1414  BP: 125/78  Pulse: 79  Weight: 167 lb (75.8 kg)    Fetal Status: Fetal Heart Rate (bpm): 171   Movement: Absent     General:  Alert, oriented and cooperative. Patient is in no acute distress.  Skin: Skin is warm and dry. No rash noted.   Cardiovascular: Normal heart rate noted  Respiratory: Normal respiratory effort, no problems with respiration noted  Abdomen: Soft, gravid, appropriate for gestational age.  Pain/Pressure: Absent     Pelvic: Cervical exam deferred        Extremities: Normal range of motion.  Edema: None  Mental Status: Normal mood and affect. Normal behavior. Normal judgment and thought content.   Assessment and Plan:  Pregnancy: G2P0010 at [redacted]w[redacted]d 1. Supervision of other normal pregnancy, antepartum (Primary) -Tried propanolol & compazine for a couple weeks but did not find them helpful. Has found improvement with Tylenol, Flonase & Allegra.  -Reports constipation. Colace is helpful somewhat but she would prefer to not take something everyday. Dietary changes  discussed. -Nausea improving   2. Asthma affecting pregnancy in first trimester -Has noticed shortness of breath when waking up in the morning & with exertion since becoming pregnant. Has inhaler at home from previous allergic reaction & found this helpful. Symptoms not worsening. Does report feeling short of breath & wheezing with activity as a child but never diagnosed with asthma. Discussed low suspicion for PE but precautions given. Rx Pulmicort & Albuterol- patient will inform if not improving or worsening. Would consider referral to Pulmonary    Preterm labor symptoms and general obstetric precautions including but not limited to vaginal bleeding, contractions, leaking of fluid and fetal movement were reviewed in detail with the patient. Please refer to After Visit Summary for other counseling recommendations.   No follow-ups on file.  Future Appointments  Date Time Provider Department Center  08/28/2023  1:15 PM [facility] NURSE [facility]  08/28/2023  1:30 PM [facility] US1 [facility]    Marylynn Pearson, [facility] 06/26/23

## 2023-06-27 ENCOUNTER — Encounter: Payer: Self-pay | Admitting: *Deleted

## 2023-06-27 MED ORDER — FLUTICASONE PROPIONATE HFA 44 MCG/ACT IN AERO: 2.0000 | INHALATION_SPRAY | Freq: Two times a day (BID) | RESPIRATORY_TRACT | 12 refills | Status: DC

## 2023-06-27 NOTE — Progress Notes (Signed)
[employer] [facility] denied PA for Pulmicort inhaler.  Flovent inhaler sent to pharmacy.  Pt to use 1-2 puffs each lungs BID per Blanche East, NP

## 2023-06-27 NOTE — Addendum Note (Signed)
Addended by: Granville Lewis on: 06/27/2023 12:50 PM   Modules accepted: Orders

## 2023-07-05 ENCOUNTER — Other Ambulatory Visit (HOSPITAL_COMMUNITY): Payer: Self-pay

## 2023-07-12 ENCOUNTER — Other Ambulatory Visit: Payer: Self-pay | Admitting: Obstetrics and Gynecology

## 2023-07-12 MED ORDER — PROMETHAZINE-DM 6.25-15 MG/5ML PO SYRP
5.0000 mL | ORAL_SOLUTION | Freq: Four times a day (QID) | ORAL | 0 refills | Status: DC | PRN
Start: 2023-07-12 — End: 2023-07-12

## 2023-07-12 MED ORDER — PROMETHAZINE-DM 6.25-15 MG/5ML PO SYRP
5.0000 mL | ORAL_SOLUTION | Freq: Four times a day (QID) | ORAL | 0 refills | Status: DC | PRN
Start: 2023-07-12 — End: 2023-10-24

## 2023-07-24 ENCOUNTER — Ambulatory Visit (INDEPENDENT_AMBULATORY_CARE_PROVIDER_SITE_OTHER): Payer: Medicaid Other | Admitting: Obstetrics and Gynecology

## 2023-07-24 VITALS — BP 132/77 | HR 77 | Wt 167.0 lb

## 2023-07-24 DIAGNOSIS — Z3480 Encounter for supervision of other normal pregnancy, unspecified trimester: Secondary | ICD-10-CM

## 2023-07-24 DIAGNOSIS — D563 Thalassemia minor: Secondary | ICD-10-CM

## 2023-07-24 DIAGNOSIS — F332 Major depressive disorder, recurrent severe without psychotic features: Secondary | ICD-10-CM

## 2023-07-24 DIAGNOSIS — G43009 Migraine without aura, not intractable, without status migrainosus: Secondary | ICD-10-CM

## 2023-07-24 NOTE — Addendum Note (Signed)
 Addended by: Kathie Dike on: 07/24/2023 03:40 PM   Modules accepted: Orders

## 2023-07-24 NOTE — Progress Notes (Signed)
   PRENATAL VISIT NOTE  Subjective:  Margaret Harvey is a 23 y.o. G2P0010 at [redacted]w[redacted]d being seen today for ongoing prenatal care.  She is currently monitored for the following issues for this low-risk pregnancy and has Generalized anxiety disorder; Herpes simplex vulvovaginitis; Severe episode of recurrent major depressive disorder, without psychotic features (HCC); Migraine headache without aura; and Supervision of other normal pregnancy, antepartum on their problem list.  Patient reports no complaints.  Contractions: Not present. Vag. Bleeding: None.  Movement: Absent. Denies leaking of fluid.   The following portions of the patient's history were reviewed and updated as appropriate: allergies, current medications, past family history, past medical history, past social history, past surgical history and problem list.   Objective:   Vitals:   07/24/23 1017  BP: 132/77  Pulse: 77  Weight: 167 lb (75.8 kg)    Fetal Status:     Movement: Absent     General:  Alert, oriented and cooperative. Patient is in no acute distress.  Skin: Skin is warm and dry. No rash noted.   Cardiovascular: Normal heart rate noted  Respiratory: Normal respiratory effort, no problems with respiration noted  Abdomen: Soft, gravid, appropriate for gestational age.  Pain/Pressure: Absent     Pelvic: Cervical exam deferred        Extremities: Normal range of motion.  Edema: None  Mental Status: Normal mood and affect. Normal behavior. Normal judgment and thought content.   Assessment and Plan:  Pregnancy: G2P0010 at [redacted]w[redacted]d  1. Migraine without aura and without status migrainosus, not intractable (Primary)  -Stopped propranolol, -Migraines much better.  -Has phenergan at home as needed.   2. Severe episode of recurrent major depressive disorder, without psychotic features (HCC)  -Doing well on lexapro 10 mg daily   3. Supervision of other normal pregnancy, antepartum  -Nausea improving -Taking metamucil  fiber gummy for constipation. -Asthma symptoms improving, recovering from Covid. Was given Pulmicort daily and albuterol as needed.  -Only taking albuterol as needed at this time. Has not used it all since Covid. 1-2 weeks.    Preterm labor symptoms and general obstetric precautions including but not limited to vaginal bleeding, contractions, leaking of fluid and fetal movement were reviewed in detail with the patient. Please refer to After Visit Summary for other counseling recommendations.   No follow-ups on file.  Future Appointments  Date Time Provider Department Center  08/28/2023  1:15 PM [facility] NURSE [facility]  08/28/2023  1:30 PM [facility] US1 [facility]    Venia Carbon, NP

## 2023-08-21 ENCOUNTER — Ambulatory Visit: Admitting: Obstetrics and Gynecology

## 2023-08-21 VITALS — BP 134/76 | HR 79 | Wt 172.0 lb

## 2023-08-21 DIAGNOSIS — Z3480 Encounter for supervision of other normal pregnancy, unspecified trimester: Secondary | ICD-10-CM | POA: Diagnosis not present

## 2023-08-21 DIAGNOSIS — N6459 Other signs and symptoms in breast: Secondary | ICD-10-CM

## 2023-08-21 MED ORDER — FLUTICASONE PROPIONATE HFA 44 MCG/ACT IN AERO: 2.0000 | INHALATION_SPRAY | Freq: Two times a day (BID) | RESPIRATORY_TRACT | 12 refills | Status: DC

## 2023-08-21 MED ORDER — FEXOFENADINE HCL 180 MG PO TABS: 180.0000 mg | ORAL_TABLET | Freq: Every day | ORAL | 1 refills | Status: AC

## 2023-08-21 MED ORDER — ALBUTEROL SULFATE HFA 108 (90 BASE) MCG/ACT IN AERS: 1.0000 | INHALATION_SPRAY | Freq: Four times a day (QID) | RESPIRATORY_TRACT | 2 refills | Status: AC | PRN

## 2023-08-21 NOTE — Progress Notes (Signed)
   PRENATAL VISIT NOTE  Subjective:  Margaret Harvey is a 23 y.o. G2P0010 at [redacted]w[redacted]d being seen today for ongoing prenatal care.  She is currently monitored for the following issues for this low-risk pregnancy and has Generalized anxiety disorder; Herpes simplex vulvovaginitis; Severe episode of recurrent major depressive disorder, without psychotic features (HCC); Migraine headache without aura; and Supervision of other normal pregnancy, antepartum on their problem list.  Patient reports skin changes on both breasts. No pain, some redness. Contractions: Not present. Vag. Bleeding: None.  Movement: Present. Denies leaking of fluid.   The following portions of the patient's history were reviewed and updated as appropriate: allergies, current medications, past family history, past medical history, past social history, past surgical history and problem list.   Objective:   Vitals:   08/21/23 1337  BP: 134/76  Pulse: 79  Weight: 172 lb (78 kg)    Fetal Status:     Movement: Present     General:  Alert, oriented and cooperative. Patient is in no acute distress.  Skin: Skin is warm and dry. No rash noted.   Cardiovascular: Normal heart rate noted  Respiratory: Normal respiratory effort, no problems with respiration noted  Abdomen: Soft, gravid, appropriate for gestational age.  Pain/Pressure: Present (round ligament pain)     Pelvic: Cervical exam deferred        Extremities: Normal range of motion.  Edema: None  Mental Status: Normal mood and affect. Normal behavior. Normal judgment and thought content.   Assessment and Plan:  Pregnancy: G2P0010 at [redacted]w[redacted]d  1. Supervision of other normal pregnancy, antepartum (Primary)  - AFP, Serum, Open Spina Bifida  2. Peau d'orange over breast  - thickened, dimpled skin of bilateral breast.  - US  BREAST COMPLETE UNI LEFT INC AXILLA; Future   Preterm labor symptoms and general obstetric precautions including but not limited to vaginal bleeding,  contractions, leaking of fluid and fetal movement were reviewed in detail with the patient. Please refer to After Visit Summary for other counseling recommendations.   No follow-ups on file.  Future Appointments  Date Time Provider Department Center  08/28/2023  1:15 PM [facility] NURSE [facility]  08/28/2023  1:30 PM [facility] US1 [facility]  09/18/2023  1:30 PM [place], Juliette Oh, NP [facility]    Almond Jaffe, NP

## 2023-08-21 NOTE — Patient Instructions (Signed)
 Peau D'Orange

## 2023-08-24 ENCOUNTER — Ambulatory Visit

## 2023-08-24 LAB — AFP, SERUM, OPEN SPINA BIFIDA
AFP MoM: 1.22
AFP Value: 53 ng/mL
Gest. Age on Collection Date: 18 wk
Maternal Age At EDD: 22.9 a
OSBR Risk 1 IN: 10000
Test Results:: NEGATIVE
Weight: 172 [lb_av]

## 2023-08-24 NOTE — Progress Notes (Unsigned)
 Attempted ph call, left message

## 2023-08-27 ENCOUNTER — Other Ambulatory Visit: Payer: Self-pay | Admitting: Obstetrics and Gynecology

## 2023-08-27 DIAGNOSIS — N6459 Other signs and symptoms in breast: Secondary | ICD-10-CM

## 2023-08-28 ENCOUNTER — Ambulatory Visit: Payer: Medicaid Other

## 2023-08-28 ENCOUNTER — Ambulatory Visit: Payer: Medicaid Other | Attending: Obstetrics and Gynecology

## 2023-08-28 ENCOUNTER — Ambulatory Visit (HOSPITAL_BASED_OUTPATIENT_CLINIC_OR_DEPARTMENT_OTHER): Admitting: Maternal & Fetal Medicine

## 2023-08-28 VITALS — BP 134/77 | HR 77

## 2023-08-28 DIAGNOSIS — O98512 Other viral diseases complicating pregnancy, second trimester: Secondary | ICD-10-CM | POA: Diagnosis not present

## 2023-08-28 DIAGNOSIS — Z363 Encounter for antenatal screening for malformations: Secondary | ICD-10-CM | POA: Insufficient documentation

## 2023-08-28 DIAGNOSIS — Z3686 Encounter for antenatal screening for cervical length: Secondary | ICD-10-CM | POA: Insufficient documentation

## 2023-08-28 DIAGNOSIS — Z3490 Encounter for supervision of normal pregnancy, unspecified, unspecified trimester: Secondary | ICD-10-CM | POA: Insufficient documentation

## 2023-08-28 DIAGNOSIS — Z3A19 19 weeks gestation of pregnancy: Secondary | ICD-10-CM | POA: Insufficient documentation

## 2023-08-28 DIAGNOSIS — O358XX0 Maternal care for other (suspected) fetal abnormality and damage, not applicable or unspecified: Secondary | ICD-10-CM | POA: Insufficient documentation

## 2023-08-28 DIAGNOSIS — A6000 Herpesviral infection of urogenital system, unspecified: Secondary | ICD-10-CM | POA: Insufficient documentation

## 2023-08-28 DIAGNOSIS — B009 Herpesviral infection, unspecified: Secondary | ICD-10-CM | POA: Diagnosis not present

## 2023-08-28 DIAGNOSIS — Z3480 Encounter for supervision of other normal pregnancy, unspecified trimester: Secondary | ICD-10-CM

## 2023-08-28 DIAGNOSIS — Z148 Genetic carrier of other disease: Secondary | ICD-10-CM | POA: Diagnosis not present

## 2023-08-28 DIAGNOSIS — O98312 Other infections with a predominantly sexual mode of transmission complicating pregnancy, second trimester: Secondary | ICD-10-CM | POA: Diagnosis not present

## 2023-08-28 DIAGNOSIS — Z3689 Encounter for other specified antenatal screening: Secondary | ICD-10-CM | POA: Insufficient documentation

## 2023-08-28 NOTE — Progress Notes (Signed)
No consult needed.

## 2023-09-11 ENCOUNTER — Encounter: Payer: Self-pay | Admitting: Obstetrics and Gynecology

## 2023-09-18 ENCOUNTER — Ambulatory Visit (INDEPENDENT_AMBULATORY_CARE_PROVIDER_SITE_OTHER): Admitting: Obstetrics and Gynecology

## 2023-09-18 ENCOUNTER — Other Ambulatory Visit (HOSPITAL_COMMUNITY)
Admission: RE | Admit: 2023-09-18 | Discharge: 2023-09-18 | Disposition: A | Discharge status: Home / Self Care | Source: Ambulatory Visit | Attending: Obstetrics and Gynecology | Admitting: Obstetrics and Gynecology

## 2023-09-18 VITALS — BP 128/76 | HR 73 | Wt 175.0 lb

## 2023-09-18 DIAGNOSIS — N898 Other specified noninflammatory disorders of vagina: Secondary | ICD-10-CM | POA: Insufficient documentation

## 2023-09-18 DIAGNOSIS — D563 Thalassemia minor: Secondary | ICD-10-CM | POA: Insufficient documentation

## 2023-09-18 DIAGNOSIS — J45909 Unspecified asthma, uncomplicated: Secondary | ICD-10-CM

## 2023-09-18 DIAGNOSIS — O99512 Diseases of the respiratory system complicating pregnancy, second trimester: Secondary | ICD-10-CM

## 2023-09-18 DIAGNOSIS — Z3480 Encounter for supervision of other normal pregnancy, unspecified trimester: Secondary | ICD-10-CM

## 2023-09-18 MED ORDER — ONDANSETRON HCL 4 MG PO TABS: 4.0000 mg | ORAL_TABLET | Freq: Three times a day (TID) | ORAL | 0 refills | Status: DC | PRN

## 2023-09-18 MED ORDER — EXCEDRIN TENSION HEADACHE 500-65 MG PO TABS: 1.0000 | ORAL_TABLET | ORAL | 1 refills | Status: DC | PRN

## 2023-09-18 NOTE — Progress Notes (Addendum)
   PRENATAL VISIT NOTE  Subjective:  Margaret Harvey is a 23 y.o. G2P0010 at [redacted]w[redacted]d being seen today for ongoing prenatal care.  She is currently monitored for the following issues for this low-risk pregnancy and has Generalized anxiety disorder; Herpes simplex vulvovaginitis; Severe episode of recurrent major depressive disorder, without psychotic features (HCC); Migraine headache without aura; and Supervision of other normal pregnancy, antepartum on their problem list.  Patient reports headache, nausea, and vaginal irritation.  Contractions: Not present. Vag. Bleeding: None.  Movement: Present. Denies leaking of fluid.   The following portions of the patient's history were reviewed and updated as appropriate: allergies, current medications, past family history, past medical history, past social history, past surgical history and problem list.   Objective:   Vitals:   09/18/23 1331  BP: 128/76  Pulse: 73  Weight: 79.4 kg    Fetal Status:     Movement: Present     General:  Alert, oriented and cooperative. Patient is in no acute distress.  Skin: Skin is warm and dry. No rash noted.   Cardiovascular: Normal heart rate noted  Respiratory: Normal respiratory effort, no problems with respiration noted  Abdomen: Soft, gravid, appropriate for gestational age.  Pain/Pressure: Present     Pelvic: Cervical exam deferred        Extremities: Normal range of motion.  Edema: None  Mental Status: Normal mood and affect. Normal behavior. Normal judgment and thought content.   Assessment and Plan:  Pregnancy: G2P0010 at [redacted]w[redacted]d  1. Supervision of other normal pregnancy, antepartum (Primary) + FM -reports stopping THC use completely a week ago and since then her nausea has increased as well as daily headaches with dizziness and seeing floaters. Has struggled with nausea throughout pregnancy but feels it's increased in the past week since stopping smoking as well. Has phenergan  but it makes her too  sleepy. Discussed dietary recs and will try Zofran  prn. Excedrin for headaches and advised her to reach out if they worsen  - acetaminophen -caffeine (EXCEDRIN TENSION HEADACHE) 500-65 MG TABS per tablet; Take 1 tablet by mouth as needed.  Dispense: 30 tablet; Refill: 1 - ondansetron  (ZOFRAN ) 4 MG tablet; Take 1 tablet (4 mg total) by mouth every 8 (eight) hours as needed for nausea or vomiting.  Dispense: 30 tablet; Refill: 0  2. Alpha thalassemia silent carrier  -Horizon and NIPS completed - Records not scanned in. Requested RN to scan into chart.  -partner hasn't been tested yet, but they are interested. Provided [employer] rep contact info for her to reach out  3. Vaginal itching -for the past 2-3 days, no abnormal discharge or odor. Self swab collected. - Cervicovaginal ancillary only( [place])  4. Asthma affecting pregnancy in second trimester -noticed recent increase in SOB and albuterol  wasn't helping. She started back using Flovent  twice a day and saw improvement with use.   Preterm labor symptoms and general obstetric precautions including but not limited to vaginal bleeding, contractions, leaking of fluid and fetal movement were reviewed in detail with the patient. Please refer to After Visit Summary for other counseling recommendations.    Future Appointments  Date Time Provider Department Center  09/20/2023 10:40 AM [facility] US  3 [facility]  09/20/2023 10:45 AM [facility] US  3 [facility]     [place], [place] [facility] 09/18/23

## 2023-09-19 LAB — CERVICOVAGINAL ANCILLARY ONLY
Bacterial Vaginitis (gardnerella): NEGATIVE
Candida Glabrata: NEGATIVE
Candida Vaginitis: NEGATIVE
Chlamydia: NEGATIVE
Comment: NEGATIVE
Comment: NEGATIVE
Comment: NEGATIVE
Comment: NEGATIVE
Comment: NEGATIVE
Comment: NORMAL
Neisseria Gonorrhea: NEGATIVE
Trichomonas: NEGATIVE

## 2023-09-19 LAB — MISCELLANEOUS GENETIC TEST

## 2023-09-20 ENCOUNTER — Ambulatory Visit
Admission: RE | Admit: 2023-09-20 | Discharge: 2023-09-20 | Disposition: A | Discharge status: Home / Self Care | Source: Ambulatory Visit | Attending: Obstetrics and Gynecology | Admitting: Obstetrics and Gynecology

## 2023-09-20 DIAGNOSIS — N6459 Other signs and symptoms in breast: Secondary | ICD-10-CM

## 2023-09-24 ENCOUNTER — Other Ambulatory Visit: Payer: Self-pay | Admitting: Obstetrics and Gynecology

## 2023-09-24 ENCOUNTER — Other Ambulatory Visit: Payer: Self-pay | Admitting: *Deleted

## 2023-09-24 DIAGNOSIS — O219 Vomiting of pregnancy, unspecified: Secondary | ICD-10-CM

## 2023-09-24 MED ORDER — EXCEDRIN MIGRAINE 250-250-65 MG PO TABS: 2.0000 | ORAL_TABLET | Freq: Four times a day (QID) | ORAL | 1 refills | Status: DC | PRN

## 2023-09-24 MED ORDER — PROMETHAZINE HCL 25 MG PO TABS
25.0000 mg | ORAL_TABLET | Freq: Four times a day (QID) | ORAL | 1 refills | Status: DC | PRN
Start: 2023-09-24 — End: 2023-10-24

## 2023-10-03 ENCOUNTER — Other Ambulatory Visit: Payer: Self-pay | Admitting: Obstetrics and Gynecology

## 2023-10-03 MED ORDER — FLUTICASONE PROPIONATE 50 MCG/ACT NA SUSP: 2.0000 | Freq: Every day | NASAL | 2 refills | Status: DC

## 2023-10-15 ENCOUNTER — Telehealth: Payer: Self-pay | Admitting: Obstetrics and Gynecology

## 2023-10-15 ENCOUNTER — Other Ambulatory Visit: Payer: Self-pay

## 2023-10-15 ENCOUNTER — Ambulatory Visit

## 2023-10-15 NOTE — Telephone Encounter (Signed)
 No show for virtual genetic counseling visit to review results of carrier screening which were positive for alpha thalassemia silent carrier.  Left a message for Ms. Alles that she can call [phone] or [phone] to reschedule if desired.   Purnell Bruch, MS, CGC

## 2023-10-16 ENCOUNTER — Ambulatory Visit (INDEPENDENT_AMBULATORY_CARE_PROVIDER_SITE_OTHER): Admitting: Obstetrics and Gynecology

## 2023-10-16 VITALS — BP 133/84 | HR 87 | Wt 183.0 lb

## 2023-10-16 DIAGNOSIS — Z3480 Encounter for supervision of other normal pregnancy, unspecified trimester: Secondary | ICD-10-CM | POA: Diagnosis not present

## 2023-10-16 DIAGNOSIS — R030 Elevated blood-pressure reading, without diagnosis of hypertension: Secondary | ICD-10-CM | POA: Diagnosis not present

## 2023-10-16 MED ORDER — FLUTICASONE PROPIONATE 50 MCG/ACT NA SUSP: 2.0000 | Freq: Every day | NASAL | 2 refills | Status: AC

## 2023-10-16 NOTE — Progress Notes (Deleted)
   PRENATAL VISIT NOTE  Subjective:  Margaret Harvey is a 23 y.o. G2P0010 at [redacted]w[redacted]d being seen today for ongoing prenatal care.  She is currently monitored for the following issues for this {Blank single:19197::"high-risk","low-risk"} pregnancy and has Generalized anxiety disorder; Herpes simplex vulvovaginitis; Severe episode of recurrent major depressive disorder, without psychotic features (HCC); Migraine headache without aura; Supervision of other normal pregnancy, antepartum; and Alpha thalassemia silent carrier on their problem list.  Patient reports {sx:14538}.  Contractions: Not present. Vag. Bleeding: None.  Movement: Present. Denies leaking of fluid.   The following portions of the patient's history were reviewed and updated as appropriate: allergies, current medications, past family history, past medical history, past social history, past surgical history and problem list.   Objective:    Vitals:   10/16/23 1329 10/16/23 1334  BP: (!) 151/94 (!) 122/91  Pulse: 89 92  Weight: 183 lb (83 kg)     Fetal Status:      Movement: Present    General: Alert, oriented and cooperative. Patient is in no acute distress.  Skin: Skin is warm and dry. No rash noted.   Cardiovascular: Normal heart rate noted  Respiratory: Normal respiratory effort, no problems with respiration noted  Abdomen: Soft, gravid, appropriate for gestational age.  Pain/Pressure: Present     Pelvic: {Blank single:19197::"Cervical exam performed in the presence of a chaperone","Cervical exam deferred"}        Extremities: Normal range of motion.  Edema: Trace  Mental Status: Normal mood and affect. Normal behavior. Normal judgment and thought content.   Assessment and Plan:  Pregnancy: G2P0010 at [redacted]w[redacted]d There are no diagnoses linked to this encounter. {Blank single:19197::"Term","Preterm"} labor symptoms and general obstetric precautions including but not limited to vaginal bleeding, contractions, leaking of fluid and  fetal movement were reviewed in detail with the patient. Please refer to After Visit Summary for other counseling recommendations.   No follow-ups on file.  Future Appointments  Date Time Provider Department Center  10/22/2023 10:30 AM [facility]ETIC [facility] None    Almond Jaffe, NP

## 2023-10-16 NOTE — Progress Notes (Signed)
   PRENATAL VISIT NOTE  Subjective:  Margaret Harvey is a 23 y.o. G2P0010 at [redacted]w[redacted]d being seen today for ongoing prenatal care.  She is currently monitored for the following issues for this low-risk pregnancy and has Generalized anxiety disorder; Herpes simplex vulvovaginitis; Severe episode of recurrent major depressive disorder, without psychotic features (HCC); Migraine headache without aura; Supervision of other normal pregnancy, antepartum; Alpha thalassemia silent carrier; and Elevated BP without diagnosis of hypertension on their problem list.  Patient reports no complaints.  Contractions: Not present. Vag. Bleeding: None.  Movement: Present. Denies leaking of fluid.   The following portions of the patient's history were reviewed and updated as appropriate: allergies, current medications, past family history, past medical history, past social history, past surgical history and problem list.   Objective:    Vitals:   10/16/23 1329 10/16/23 1334 10/16/23 1400  BP: (!) 151/94 (!) 122/91 133/84  Pulse: 89 92 87  Weight: 183 lb (83 kg)      Fetal Status:  Fetal Heart Rate (bpm): 155   Movement: Present    General: Alert, oriented and cooperative. Patient is in no acute distress.  Skin: Skin is warm and dry. No rash noted.   Cardiovascular: Normal heart rate noted  Respiratory: Normal respiratory effort, no problems with respiration noted  Abdomen: Soft, gravid, appropriate for gestational age.  Pain/Pressure: Present     Pelvic: Cervical exam deferred        Extremities: Normal range of motion.  Edema: Trace  Mental Status: Normal mood and affect. Normal behavior. Normal judgment and thought content.   Assessment and Plan:  Pregnancy: G2P0010 at [redacted]w[redacted]d  1. Elevated BP without diagnosis of hypertension (Primary)  -BP elevated today; 2 elevations and after 15 mins of sitting BP 133/84 -This is new onset -no HA's, reports some vision changes however thinks her vision has overall  changed during this pregnancy, plans to call her eye Dr. For an updated eye exam.  -CBC, CMP, UPC today  - Reviewed PIH precautions.  -Some shortness of breath that is very mild. No chest pain.  -She will return this week for BP check in the office.   2. Supervision of other normal pregnancy, antepartum  - 2 hour GTT at next visit.  - Consider referral to Dr. Emmette Harms with [facility] if SOB worsens. No concerning S/s currently.     Preterm labor symptoms and general obstetric precautions including but not limited to vaginal bleeding, contractions, leaking of fluid and fetal movement were reviewed in detail with the patient. Please refer to After Visit Summary for other counseling recommendations.   No follow-ups on file.  Future Appointments  Date Time Provider Department Center  10/22/2023 10:30 AM [facility] GENETIC [facility] None  10/30/2023  8:30 AM [facility] NURSE [facility]  10/30/2023 11:10 AM Jan Mcgill, MD [facility]    Almond Jaffe, NP

## 2023-10-17 LAB — COMPREHENSIVE METABOLIC PANEL WITH GFR
ALT: 15 IU/L (ref 0–32)
AST: 16 IU/L (ref 0–40)
Albumin: 4 g/dL (ref 4.0–5.0)
Alkaline Phosphatase: 113 IU/L (ref 44–121)
BUN/Creatinine Ratio: 10 (ref 9–23)
BUN: 5 mg/dL — ABNORMAL LOW (ref 6–20)
Bilirubin Total: <0.2 mg/dL (ref 0.0–1.2)
CO2: 21 mmol/L (ref 20–29)
Calcium: 9.3 mg/dL (ref 8.7–10.2)
Chloride: 98 mmol/L (ref 96–106)
Creatinine, Ser: 0.52 mg/dL — ABNORMAL LOW (ref 0.57–1.00)
Globulin, Total: 3.1 g/dL (ref 1.5–4.5)
Glucose: 88 mg/dL (ref 70–99)
Potassium: 3.5 mmol/L (ref 3.5–5.2)
Sodium: 137 mmol/L (ref 134–144)
Total Protein: 7.1 g/dL (ref 6.0–8.5)
eGFR: 135 mL/min/{1.73_m2} (ref >59)

## 2023-10-17 LAB — PROTEIN / CREATININE RATIO, URINE
Creatinine, Urine: 17.2 mg/dL
Protein, Ur: <4 mg/dL

## 2023-10-17 LAB — CBC
Hematocrit: 34.3 % (ref 34.0–46.6)
Hemoglobin: 11.1 g/dL (ref 11.1–15.9)
MCH: 27.8 pg (ref 26.6–33.0)
MCHC: 32.4 g/dL (ref 31.5–35.7)
MCV: 86 fL (ref 79–97)
Platelets: 313 10*3/uL (ref 150–450)
RBC: 3.99 x10E6/uL (ref 3.77–5.28)
RDW: 12.4 % (ref 11.7–15.4)
WBC: 9.8 10*3/uL (ref 3.4–10.8)

## 2023-10-18 ENCOUNTER — Ambulatory Visit

## 2023-10-19 ENCOUNTER — Ambulatory Visit

## 2023-10-19 ENCOUNTER — Ambulatory Visit: Admitting: Certified Nurse Midwife

## 2023-10-19 VITALS — BP 125/89 | HR 80

## 2023-10-19 DIAGNOSIS — O132 Gestational [pregnancy-induced] hypertension without significant proteinuria, second trimester: Secondary | ICD-10-CM | POA: Diagnosis not present

## 2023-10-19 MED ORDER — NIFEDIPINE ER OSMOTIC RELEASE 30 MG PO TB24: 30.0000 mg | ORAL_TABLET | Freq: Every day | ORAL | 5 refills | Status: DC

## 2023-10-19 NOTE — Progress Notes (Addendum)
   PRENATAL VISIT NOTE  Subjective:  Margaret Harvey is a 23 y.o. G2P0010 at [redacted]w[redacted]d being seen today for ongoing prenatal care.  She is currently monitored for the following issues for this low-risk pregnancy and has Generalized anxiety disorder; Herpes simplex vulvovaginitis; Severe episode of recurrent major depressive disorder, without psychotic features (HCC); Migraine headache without aura; Supervision of other normal pregnancy, antepartum; Alpha thalassemia silent carrier; Elevated BP without diagnosis of hypertension; and Gestational hypertension on their problem list.  Patient reports no complaints.  Contractions: Not present. Vag. Bleeding: None.  Movement: Present. Denies leaking of fluid.   The following portions of the patient's history were reviewed and updated as appropriate: allergies, current medications, past family history, past medical history, past social history, past surgical history and problem list.   Objective:    Vitals:   10/19/23 1108 10/19/23 1110  BP: (!) 136/91 125/89  Pulse: 78 80    Fetal Status:  Fetal Heart Rate (bpm): 150 Fundal Height: 27 cm Movement: Present    General: Alert, oriented and cooperative. Patient is in no acute distress.  Skin: Skin is warm and dry. No rash noted.   Cardiovascular: Normal heart rate noted  Respiratory: Normal respiratory effort, no problems with respiration noted  Abdomen: Soft, gravid, appropriate for gestational age.  Pain/Pressure: Absent     Pelvic: Cervical exam deferred        Extremities: Normal range of motion.  Edema: None  Mental Status: Normal mood and affect. Normal behavior. Normal judgment and thought content.   Assessment and Plan:  Pregnancy: G2P0010 at 101w3d 1. Pregnancy-induced hypertension in second trimester (Primary) - Previous visit she had elevated BP and benign labs. Today her elevated pressure rules in for PIH. Will start medication as follows: - NIFEdipine  (PROCARDIA  XL) 30 MG 24 hr tablet;  Take 1 tablet (30 mg total) by mouth daily.  Dispense: 30 tablet; Refill: 5 - Discussed plan with Dr. Vallarie Gauze. Advised to schedule with MFM (message sent), to inform patient of warning s/s of PEC, to get patient scheduled for weekly labs, NSTs, and appointments (message sent).   Preterm labor symptoms and general obstetric precautions including but not limited to vaginal bleeding, contractions, leaking of fluid and fetal movement were reviewed in detail with the patient. Please refer to After Visit Summary for other counseling recommendations.   Return in about 1 week (around 10/26/2023) for LOB.  Future Appointments  Date Time Provider Department Center  10/30/2023  8:30 AM [facility] NURSE [facility]  10/30/2023 11:10 AM Jan Mcgill, MD [facility]    Salomon Cree, CNM

## 2023-10-19 NOTE — Progress Notes (Unsigned)
 Subjective:  Margaret Harvey is a 23 y.o. female here for BP check.   Hypertension ROS: Patient denies any headaches, visual symptoms, RUQ/epigastric pain or other concerning symptoms. Pt reported good fetal movement and denied any symptoms of preterm labor.  Objective:  LMP 03/10/2023   Appearance alert, well appearing, and in no distress. General exam BP noted to be 136/91 today in office, 2nd reading 125/89.    Assessment:   Blood Pressure stable.   Plan:  Patient moved to provider schedule to discuss treatment plans Reviewed blood pressure symptoms and values to report to provider. Patient verbalized understanding.     Evelia Hipp, RN

## 2023-10-22 ENCOUNTER — Ambulatory Visit: Attending: Maternal & Fetal Medicine | Admitting: Obstetrics and Gynecology

## 2023-10-22 ENCOUNTER — Other Ambulatory Visit: Payer: Self-pay

## 2023-10-22 ENCOUNTER — Ambulatory Visit: Payer: Self-pay | Admitting: Maternal & Fetal Medicine

## 2023-10-22 DIAGNOSIS — Z3A26 26 weeks gestation of pregnancy: Secondary | ICD-10-CM

## 2023-10-22 DIAGNOSIS — O99012 Anemia complicating pregnancy, second trimester: Secondary | ICD-10-CM | POA: Diagnosis not present

## 2023-10-22 DIAGNOSIS — D563 Thalassemia minor: Secondary | ICD-10-CM | POA: Diagnosis not present

## 2023-10-22 DIAGNOSIS — Z3480 Encounter for supervision of other normal pregnancy, unspecified trimester: Secondary | ICD-10-CM

## 2023-10-22 NOTE — Progress Notes (Signed)
 Virtual Visit via Video Note  I connected with Margaret Harvey on 10/22/23 at 10:30 AM EDT by a video enabled telemedicine application and verified that I am speaking with the correct person using two identifiers.  Location: Patient: home () Provider: [facility] [place]   Referring provider: Almond Jaffe, [facility] Length of consultation: 25 minutes virtual visit  Margaret Harvey was seen for genetic counseling at [facility] to review the results of her carrier screening which showed her to be a silent carrier for alpha-thalassemia.  She was present on this virtual visit alone.   Margaret Harvey had [employer] carrier screening performed through [employer]. The results of the screen identified her as a silent carrier for alpha-thalassemia (aa/a-). The screening also included testing for cystic fibrosis, spinal muscular atrophy and Beta-hemoglobin abnormalities including sickle cell disease and beta thalassemia.  The screening was negative for these conditions, which greatly reduces but cannot eliminate the chance that she is a carrier for these conditions. See that report for details and residual risk estimates.  Alpha thalassemia: Alpha-thalassemia is different in its inheritance compared to other hemoglobinopathies as there are two copies of two alpha globin genes (HBA1 and HBA2) on each chromosome 16, or four alpha globin genes total (aa/aa). A person can be a carrier of one alpha gene mutation (aa/a-), also referred to as a "silent carrier". A person who carries two alpha globin gene mutations can either carry them in cis (both on the same chromosome, denoted as aa/--) or in trans (on different chromosomes, denoted as a-/a-). Alpha-thalassemia carriers of two mutations who have African American ancestry are more likely to have a trans arrangement (a-/a-); cis configuration is reported to be rare in individuals with African American ancestry.     There are several  different forms of alpha-thalassemia. The most severe form of alpha-thalassemia, Hb Barts, is associated with an absence of alpha globin chain synthesis as a result of deletions of all four alpha globin genes (--/--).  Given that Margaret Harvey is a silent carrier (aa/a-), her pregnancies would not be at increased risk for Hb Barts, even if her partner is a carrier for alpha-thalassemia, as she will always pass on at least one copy of the alpha globin gene to her children. Hemoglobin H (HbH) disease is caused by three deleted or dysfunctioning alpha globin alleles (a-/--) and is characterized by microcytic hypochromic hemolytic anemia, hepatosplenomegaly, mild jaundice, growth retardation, and sometimes thalassemia-like bone changes. Given Margaret Harvey's silent carrier status (aa/a-), the current fetus would only be at risk for HbH disease (a-/--), if her partner is a carrier for two alpha globin mutations in cis (aa/--). If this is the case, the risk for HbH disease in the pregnancy would be 1 in 4 (25%). However, if Margaret Harvey's partner is a carrier for two alpha globin mutations, he would be more likely to carry them in trans configuration (a-/a-) than the cis configuration (aa/--), given his African American ethnicity. If he is a carrier of alpha-thalassemia in trans, then the pregnancy would not be at increased risk for HbH disease and would at most be a carrier of two gene changes in trans (a-/a-).  Carriers may have mild anemia, or small red blood cells (low MCV on CBC), but are expected to be healthy. Based on the carrier frequency for alpha-thalassemia in the African American population, Margaret Harvey's partner has a 1 in 30 chance of being any type of carrier for alpha-thalassemia. We also  discussed that if both parents are known to be carriers, then testing during pregnancy through amniocentesis or CVS would be made available.  Aneuploidy screening: The results of the [employer] cell free DNA testing were also  reviewed.  These results showed a less than 1 in 10,000 risk for trisomies 21, 18 and 13, and monosomy X (Turner syndrome). In addition, the risk for triploidy and sex chromosome trisomies (47,XXX and 47,XXY) was also low. While this testing identifies 94-99% of pregnancies with trisomy 78, trisomy 29, and trisomy 64, and >70% of cases of sex chromosome aneuploidies, it is NOT diagnostic. A positive test result requires confirmation by CVS or amniocentesis, and a negative test result does not rule out a fetal chromosome abnormality. This testing does not identify all genetic conditions.  Screening for open neural tube defects was also performed by her OB through maternal serum AFP only. Those results showed a 1 in 10,000 chance for an open neural tube defect in this pregnancy. This testing detects approximately 80% of babies with open spinal defects.   Family/Pregnancy history: We also obtained a detailed family history and pregnancy history.  The patient stated that this is her second pregnancy. The first pregnancy ended in elective termination for personal reasons.  She denied any complications or exposure to tobacco, alcohol or recreational drugs in this pregnancy.  She is taking medication for hypertension (nifedipine ) and depression (lexapro ) as prescribed by her OB. In the family history, John reported that Gwinda Leopard has four full sisters and three half sisters. One sister has a daughter who was born with a heart defect.  The type of defect is not known, and the child is said to have no other physical health concerns or developmental differences.  We discussed that congenital heart defects (CHDs) occur in approximately 1 in 200 births.  Congenital heart defects are often thought to be multifactorial, meaning due to complex interactions between genetic and environmental factors, although some specific genetic changes and syndromes are associated with congenital heart defects.  It would be helpful to know if  any tests were done on this niece in order to better assess the risk in this family.   In the absence of an underlying genetic condition, the chance of congenital heart defects in first cousins of individuals with a heart defect is thought to be less than 1%.  For this reason, it would be reasonable to consider a fetal echocardiogram at 22 to 24 weeks if there were any concerns based upon ultrasound in the second trimester. The remainder of the family history was unremarkable for birth defects, intellectual disabilities, recurrent pregnancy loss or known genetic conditions.  Plan of care: Margaret Harvey elected to proceed with [employer] testing for her partner. We will request that [employer] send a saliva sample kit to her home address for: Laurine Pore, dob 08/20/1991, phone [phone]. We will reach out when his results become available.  We appreciate being involved in the care of this patient and can be reached at [phone] with any questions or concerns.   In total, 40 minutes were spent on the date of the encounter in service to the patient including preparation, record review, virtual video consultation, documentation and care coordination.  Donnamaria Gable

## 2023-10-23 ENCOUNTER — Encounter: Payer: Self-pay | Admitting: Certified Nurse Midwife

## 2023-10-23 ENCOUNTER — Encounter (HOSPITAL_COMMUNITY): Payer: Self-pay | Admitting: Obstetrics and Gynecology

## 2023-10-23 ENCOUNTER — Inpatient Hospital Stay (HOSPITAL_COMMUNITY)
Admission: AD | Admit: 2023-10-23 | Discharge: 2023-10-23 | Disposition: A | Discharge status: Home / Self Care | Attending: Family Medicine | Admitting: Family Medicine

## 2023-10-23 DIAGNOSIS — O1493 Unspecified pre-eclampsia, third trimester: Secondary | ICD-10-CM | POA: Insufficient documentation

## 2023-10-23 DIAGNOSIS — O1410 Severe pre-eclampsia, unspecified trimester: Secondary | ICD-10-CM | POA: Insufficient documentation

## 2023-10-23 DIAGNOSIS — O133 Gestational [pregnancy-induced] hypertension without significant proteinuria, third trimester: Secondary | ICD-10-CM

## 2023-10-23 DIAGNOSIS — Z3A27 27 weeks gestation of pregnancy: Secondary | ICD-10-CM

## 2023-10-23 DIAGNOSIS — R110 Nausea: Secondary | ICD-10-CM

## 2023-10-23 DIAGNOSIS — O1490 Unspecified pre-eclampsia, unspecified trimester: Secondary | ICD-10-CM | POA: Insufficient documentation

## 2023-10-23 DIAGNOSIS — O132 Gestational [pregnancy-induced] hypertension without significant proteinuria, second trimester: Secondary | ICD-10-CM | POA: Diagnosis not present

## 2023-10-23 DIAGNOSIS — R519 Headache, unspecified: Secondary | ICD-10-CM | POA: Diagnosis present

## 2023-10-23 DIAGNOSIS — O26893 Other specified pregnancy related conditions, third trimester: Secondary | ICD-10-CM

## 2023-10-23 DIAGNOSIS — Z3480 Encounter for supervision of other normal pregnancy, unspecified trimester: Secondary | ICD-10-CM

## 2023-10-23 DIAGNOSIS — O26892 Other specified pregnancy related conditions, second trimester: Secondary | ICD-10-CM | POA: Insufficient documentation

## 2023-10-23 DIAGNOSIS — O139 Gestational [pregnancy-induced] hypertension without significant proteinuria, unspecified trimester: Secondary | ICD-10-CM | POA: Insufficient documentation

## 2023-10-23 DIAGNOSIS — N949 Unspecified condition associated with female genital organs and menstrual cycle: Secondary | ICD-10-CM | POA: Insufficient documentation

## 2023-10-23 HISTORY — DX: Essential (primary) hypertension: I10

## 2023-10-23 LAB — COMPREHENSIVE METABOLIC PANEL WITH GFR
ALT: 14 U/L (ref 0–44)
AST: 18 U/L (ref 15–41)
Albumin: 3.2 g/dL — ABNORMAL LOW (ref 3.5–5.0)
Alkaline Phosphatase: 126 U/L (ref 38–126)
Anion gap: 11 (ref 5–15)
BUN: 5 mg/dL — ABNORMAL LOW (ref 6–20)
CO2: 22 mmol/L (ref 22–32)
Calcium: 9 mg/dL (ref 8.9–10.3)
Chloride: 101 mmol/L (ref 98–111)
Creatinine, Ser: 0.44 mg/dL (ref 0.44–1.00)
GFR, Estimated: >60 mL/min (ref >60)
Glucose, Bld: 105 mg/dL — ABNORMAL HIGH (ref 70–99)
Potassium: 3.3 mmol/L — ABNORMAL LOW (ref 3.5–5.1)
Sodium: 134 mmol/L — ABNORMAL LOW (ref 135–145)
Total Bilirubin: 0.3 mg/dL (ref 0.0–1.2)
Total Protein: 7.2 g/dL (ref 6.5–8.1)

## 2023-10-23 LAB — CBC
HCT: 34.6 % — ABNORMAL LOW (ref 36.0–46.0)
Hemoglobin: 11.5 g/dL — ABNORMAL LOW (ref 12.0–15.0)
MCH: 27.5 pg (ref 26.0–34.0)
MCHC: 33.2 g/dL (ref 30.0–36.0)
MCV: 82.8 fL (ref 80.0–100.0)
Platelets: 312 10*3/uL (ref 150–400)
RBC: 4.18 MIL/uL (ref 3.87–5.11)
RDW: 13.1 % (ref 11.5–15.5)
WBC: 10 10*3/uL (ref 4.0–10.5)
nRBC: 0 % (ref 0.0–0.2)

## 2023-10-23 LAB — PROTEIN / CREATININE RATIO, URINE
Creatinine, Urine: 12 mg/dL
Total Protein, Urine: <6 mg/dL

## 2023-10-23 LAB — URINALYSIS, ROUTINE W REFLEX MICROSCOPIC
Bilirubin Urine: NEGATIVE
Glucose, UA: NEGATIVE mg/dL
Hgb urine dipstick: NEGATIVE
Ketones, ur: NEGATIVE mg/dL
Leukocytes,Ua: NEGATIVE
Nitrite: NEGATIVE
Protein, ur: NEGATIVE mg/dL
Specific Gravity, Urine: 1.001 — ABNORMAL LOW (ref 1.005–1.030)
pH: 6 (ref 5.0–8.0)

## 2023-10-23 LAB — FETAL FIBRONECTIN: Fetal Fibronectin: NEGATIVE

## 2023-10-23 MED ORDER — ONDANSETRON 4 MG PO TBDP: 4.0000 mg | ORAL_TABLET | Freq: Four times a day (QID) | ORAL | 0 refills | Status: DC | PRN

## 2023-10-23 MED ORDER — NIFEDIPINE ER OSMOTIC RELEASE 30 MG PO TB24: 60.0000 mg | ORAL_TABLET | Freq: Every day | ORAL | 5 refills | Status: DC

## 2023-10-23 MED ORDER — ACETAMINOPHEN-CAFFEINE 500-65 MG PO TABS
2.0000 | ORAL_TABLET | Freq: Once | ORAL | Status: AC
  Administered 2023-10-23: 2 via ORAL
  Filled 2023-10-23: qty 2

## 2023-10-23 MED ORDER — ONDANSETRON 4 MG PO TBDP
8.0000 mg | ORAL_TABLET | Freq: Once | ORAL | Status: AC
  Administered 2023-10-23: 8 mg via ORAL
  Filled 2023-10-23: qty 2

## 2023-10-23 MED ORDER — CYCLOBENZAPRINE HCL 5 MG PO TABS
10.0000 mg | ORAL_TABLET | Freq: Once | ORAL | Status: AC
  Administered 2023-10-23: 10 mg via ORAL
  Filled 2023-10-23: qty 2

## 2023-10-23 NOTE — MAU Provider Note (Signed)
 Chief Complaint:  Hypertension   Event Date/Time   First Provider Initiated Contact with Patient 10/23/23 2010     HPI: Margaret Harvey is a 23 y.o. G2P0010 at 77w0dwho presents to maternity admissions reporting elevated blood pressure at home with a headache today.  Took her Procardia  this AM.  Has not taken anything for headache.  Had a few times she noticed the baby was balling up.. She reports good fetal movement, denies LOF, vaginal bleeding, urinary symptoms, dizziness, or fever/chills.    Hypertension This is a recurrent problem. The problem has been gradually worsening since onset. Associated symptoms include headaches. Pertinent negatives include no shortness of breath. There are no associated agents to hypertension. Treatments tried: Procardia  XL 30mg . There are no compliance problems.    RN Note:    Margaret Harvey is a 23 y.o. at [redacted]w[redacted]d here in MAU reporting HTN. She took it at home and it was 156/96 tonight. She also has a h/a and nausea. Has had h/a all day. Took her Procardia  this am. Has not taken any pain med for h/a. Reports good FM and denies LOF or VB. Has felt like the baby has balled up a few times today      Past Medical History: Past Medical History:  Diagnosis Date   Depression    GAD (generalized anxiety disorder)    Genital herpes    Hypertension    Seasonal allergies     Past obstetric history: OB History  Gravida Para Term Preterm AB Living  2    1   SAB IAB Ectopic Multiple Live Births  0 1       # Outcome Date GA Lbr Len/2nd Weight Sex Type Anes PTL Lv  2 Current           1 IAB             Past Surgical History: Past Surgical History:  Procedure Laterality Date   ABCESS DRAINAGE     BREAST REDUCTION SURGERY     DILATION AND EVACUATION  05/2022    Family History: Family History  Problem Relation Age of Onset   Sudden death Neg Hx    Hypertension Neg Hx    Hyperlipidemia Neg Hx    Heart attack Neg Hx    Diabetes Neg Hx    Migraines  Neg Hx    Seizures Neg Hx    Depression Neg Hx    Anxiety disorder Neg Hx    ADD / ADHD Neg Hx    Autism Neg Hx    Bipolar disorder Neg Hx    Schizophrenia Neg Hx     Social History: Social History   Tobacco Use   Smoking status: Never   Smokeless tobacco: Never  Vaping Use   Vaping status: Some Days  Substance Use Topics   Alcohol use: Yes    Comment: occ   Drug use: Yes    Types: Marijuana    Allergies:  Allergies  Allergen Reactions   Other Anaphylaxis and Other (See Comments)    Almonds    Meds:  Medications Prior to Admission  Medication Sig Dispense Refill Last Dose/Taking   acetaminophen  (TYLENOL ) 325 MG tablet Take 650 mg by mouth every 6 (six) hours as needed.   Past Month   aspirin-acetaminophen -caffeine (EXCEDRIN  MIGRAINE) 250-250-65 MG tablet Take 2 tablets by mouth every 6 (six) hours as needed for headache. (Patient not taking: Reported on 10/16/2023) 30 tablet 1    Doxylamine Succinate, Sleep, (UNISOM  PO) Take 1 tablet by mouth as needed (in evening).   Past Week   escitalopram  (LEXAPRO ) 10 MG tablet Take 1 tablet (10 mg total) by mouth daily. 90 tablet 0 10/23/2023   fexofenadine  (ALLEGRA ) 180 MG tablet Take 1 tablet (180 mg total) by mouth daily. 90 tablet 1 10/23/2023   fluticasone  (FLONASE ) 50 MCG/ACT nasal spray Place 2 sprays into both nostrils daily. 64 mL 2 Past Week   fluticasone  (FLOVENT  HFA) 44 MCG/ACT inhaler Inhale 2 puffs into the lungs 2 (two) times daily. 1 each 12 10/22/2023   NIFEdipine  (PROCARDIA  XL) 30 MG 24 hr tablet Take 1 tablet (30 mg total) by mouth daily. 30 tablet 5 10/23/2023   Prenatal Vit-Fe Fumarate-FA (PRENATAL VITAMINS PO) Take by mouth.   10/22/2023   promethazine  (PHENERGAN ) 25 MG tablet Take 1 tablet (25 mg total) by mouth every 6 (six) hours as needed for nausea or vomiting. 30 tablet 1 10/22/2023   acetaminophen -caffeine (EXCEDRIN  TENSION HEADACHE) 500-65 MG TABS per tablet Take 1 tablet by mouth as needed. (Patient not taking:  Reported on 10/16/2023) 30 tablet 1    albuterol  (VENTOLIN  HFA) 108 (90 Base) MCG/ACT inhaler Inhale 1-2 puffs into the lungs every 6 (six) hours as needed for wheezing or shortness of breath. 1 each 2    docusate sodium (COLACE) 100 MG capsule Take 100 mg by mouth 2 (two) times daily as needed for mild constipation. (Patient not taking: Reported on 10/23/2023)   Not Taking   ondansetron  (ZOFRAN ) 4 MG tablet Take 1 tablet (4 mg total) by mouth every 8 (eight) hours as needed for nausea or vomiting. (Patient not taking: Reported on 10/16/2023) 30 tablet 0    prochlorperazine  (COMPAZINE ) 10 MG tablet Take 1 tablet (10 mg total) by mouth every 6 (six) hours as needed (As needed for migraine headache.). (Patient not taking: Reported on 10/16/2023) 30 tablet 1    promethazine -dextromethorphan (PROMETHAZINE -DM) 6.25-15 MG/5ML syrup Take 5 mLs by mouth 4 (four) times daily as needed for cough. (Patient not taking: Reported on 10/16/2023) 180 mL 0     I have reviewed patient's Past Medical Hx, Surgical Hx, Family Hx, Social Hx, medications and allergies.   ROS:  Review of Systems  Constitutional:  Negative for chills and fever.  Eyes:  Negative for visual disturbance (some blurriness from time to time).  Respiratory:  Negative for shortness of breath.   Gastrointestinal:  Positive for nausea. Negative for abdominal pain, diarrhea and vomiting.  Genitourinary:  Negative for vaginal bleeding.  Neurological:  Positive for headaches.   Other systems negative  Physical Exam  Patient Vitals for the past 24 hrs:  BP Temp Pulse Resp SpO2 Height Weight  10/23/23 2003 (!) 145/92 -- (!) 101 -- -- -- --  10/23/23 1940 (!) 152/90 -- -- -- -- -- --  10/23/23 1938 -- 99.1 F (37.3 C) 95 17 100 % 5' 2 (1.575 m) 81.9 kg   Vitals:   10/23/23 1940 10/23/23 2003 10/23/23 2015 10/23/23 2030  BP: (!) 152/90 (!) 145/92 (!) 149/91 (!) 146/95   10/23/23 2045 10/23/23 2100 10/23/23 2115 10/23/23 2130  BP: (!) 148/91 (!)  149/87 (!) 155/89 (!) 152/84    Constitutional: Well-developed, well-nourished female in no acute distress.  Cardiovascular: normal rate and rhythm Respiratory: normal effort GI: Abd soft, non-tender, gravid appropriate for gestational age.   No rebound or guarding. MS: Extremities nontender, no edema, normal ROM Neurologic: Alert and oriented x 4. DTRs 2+ with no clonus  Dilation: Closed Effacement (%): Thick Cervical Position: Posterior Station: Ballotable Presentation: Undeterminable Exam by:: Broadus Canes CNM   FHT:  Baseline 150 , moderate variability, accelerations present, no decelerations Contractions: Uterine irritability, not painful per patient   Labs: Results for orders placed or performed during the hospital encounter of 10/23/23 (from the past 24 hours)  Urinalysis, Routine w reflex microscopic -Urine, Clean Catch     Status: Abnormal   Collection Time: 10/23/23  7:41 PM  Result Value Ref Range   Color, Urine COLORLESS (A) YELLOW   APPearance CLEAR CLEAR   Specific Gravity, Urine 1.001 (L) 1.005 - 1.030   pH 6.0 5.0 - 8.0   Glucose, UA NEGATIVE NEGATIVE mg/dL   Hgb urine dipstick NEGATIVE NEGATIVE   Bilirubin Urine NEGATIVE NEGATIVE   Ketones, ur NEGATIVE NEGATIVE mg/dL   Protein, ur NEGATIVE NEGATIVE mg/dL   Nitrite NEGATIVE NEGATIVE   Leukocytes,Ua NEGATIVE NEGATIVE  Protein / creatinine ratio, urine     Status: None   Collection Time: 10/23/23  7:57 PM  Result Value Ref Range   Creatinine, Urine 12 mg/dL   Total Protein, Urine <6 mg/dL   Protein Creatinine Ratio        0.00 - 0.15 mg/mg[Cre]  CBC     Status: Abnormal   Collection Time: 10/23/23  8:46 PM  Result Value Ref Range   WBC 10.0 4.0 - 10.5 K/uL   RBC 4.18 3.87 - 5.11 MIL/uL   Hemoglobin 11.5 (L) 12.0 - 15.0 g/dL   HCT 57.8 (L) 46.9 - 62.9 %   MCV 82.8 80.0 - 100.0 fL   MCH 27.5 26.0 - 34.0 pg   MCHC 33.2 30.0 - 36.0 g/dL   RDW 52.8 41.3 - 24.4 %   Platelets 312 150 - 400 K/uL   nRBC 0.0 0.0  - 0.2 %  Comprehensive metabolic panel with GFR     Status: Abnormal   Collection Time: 10/23/23  8:46 PM  Result Value Ref Range   Sodium 134 (L) 135 - 145 mmol/L   Potassium 3.3 (L) 3.5 - 5.1 mmol/L   Chloride 101 98 - 111 mmol/L   CO2 22 22 - 32 mmol/L   Glucose, Bld 105 (H) 70 - 99 mg/dL   BUN 5 (L) 6 - 20 mg/dL   Creatinine, Ser 0.10 0.44 - 1.00 mg/dL   Calcium 9.0 8.9 - 27.2 mg/dL   Total Protein 7.2 6.5 - 8.1 g/dL   Albumin 3.2 (L) 3.5 - 5.0 g/dL   AST 18 15 - 41 U/L   ALT 14 0 - 44 U/L   Alkaline Phosphatase 126 38 - 126 U/L   Total Bilirubin 0.3 0.0 - 1.2 mg/dL   GFR, Estimated >53 >66 mL/min   Anion gap 11 5 - 15  Fetal fibronectin     Status: None   Collection Time: 10/23/23  9:46 PM  Result Value Ref Range   Fetal Fibronectin NEGATIVE NEGATIVE    O/Positive/-- (01/14 1539)  Imaging:  No results found.  MAU Course/MDM: I have reviewed the triage vital signs and the nursing notes.   Pertinent labs & imaging results that were available during my care of the patient were reviewed by me and considered in my medical decision making (see chart for details).      I have reviewed her medical records including past results, notes and treatments.   I have ordered labs and reviewed results. These were normal Normal transaminases and platelets. NST reviewed, reassuring for gestational  age Consult Dr Daisey Dryer with presentation, exam findings and test results.  Treatments in MAU included Excedrin  Tension and Flexeril which helped headache.  Zofran  for nausea.  Had some uterine irritability so we sent a Fetal Fibronectin which was negative.  Cervix was long and closed. .    Assessment: [facility] IUP at [redacted]w[redacted]d Gestational Hypertension Nausea Headache, resolved Uterine irritability with negative Fetal Fibronectin  Plan: Discharge home Preterm Labor precautions and fetal kick counts Follow up in Office for prenatal visits and recheck of BP Encouraged to return if she develops  worsening of symptoms, increase in pain, fever, or other concerning symptoms.   Pt stable at time of discharge.  Holmes Lusher CNM, MSN Certified Nurse-Midwife 10/23/2023 8:10 PM

## 2023-10-23 NOTE — Progress Notes (Signed)
 Difficult to monitor FHTs due to gestation and fetal movement

## 2023-10-23 NOTE — Progress Notes (Signed)
 Written and verbal d/c instructions given and understanding voiced.

## 2023-10-23 NOTE — Progress Notes (Signed)
OK to d/c efm per Wynelle BourgeoisMarie Williams CNM

## 2023-10-23 NOTE — MAU Note (Addendum)
 Jeanita Collar is a 23 y.o. at [redacted]w[redacted]d here in MAU reporting HTN. She took it at home and it was 156/96 tonight. She also has a h/a and nausea. Has had h/a all day. Took her Procardia  this am. Has not taken any pain med for h/a. Reports good FM and denies LOF or VB. Has felt like the baby has "balled up" a few times today  LMP: n/a Onset of complaint: this am Pain score: 8 Vitals:   10/23/23 1938 10/23/23 1940  BP:  (!) 152/90  Pulse: 95   Resp: 17   Temp: 99.1 F (37.3 C)   SpO2: 100%      FHT: 145  Lab orders placed from triage: u/a

## 2023-10-24 ENCOUNTER — Encounter: Payer: Self-pay | Admitting: Obstetrics and Gynecology

## 2023-10-24 ENCOUNTER — Ambulatory Visit: Admitting: Obstetrics and Gynecology

## 2023-10-24 VITALS — BP 142/92 | HR 111 | Wt 176.0 lb

## 2023-10-24 DIAGNOSIS — Z3A27 27 weeks gestation of pregnancy: Secondary | ICD-10-CM | POA: Diagnosis not present

## 2023-10-24 DIAGNOSIS — O99342 Other mental disorders complicating pregnancy, second trimester: Secondary | ICD-10-CM | POA: Diagnosis not present

## 2023-10-24 DIAGNOSIS — O98312 Other infections with a predominantly sexual mode of transmission complicating pregnancy, second trimester: Secondary | ICD-10-CM

## 2023-10-24 DIAGNOSIS — O132 Gestational [pregnancy-induced] hypertension without significant proteinuria, second trimester: Secondary | ICD-10-CM | POA: Diagnosis not present

## 2023-10-24 DIAGNOSIS — A6004 Herpesviral vulvovaginitis: Secondary | ICD-10-CM

## 2023-10-24 DIAGNOSIS — F411 Generalized anxiety disorder: Secondary | ICD-10-CM

## 2023-10-24 DIAGNOSIS — D563 Thalassemia minor: Secondary | ICD-10-CM | POA: Diagnosis not present

## 2023-10-24 DIAGNOSIS — Z3480 Encounter for supervision of other normal pregnancy, unspecified trimester: Secondary | ICD-10-CM

## 2023-10-24 DIAGNOSIS — R350 Frequency of micturition: Secondary | ICD-10-CM

## 2023-10-24 LAB — POCT URINALYSIS DIPSTICK
Bilirubin, UA: NEGATIVE
Blood, UA: NEGATIVE
Glucose, UA: NEGATIVE
Ketones, UA: NEGATIVE
Leukocytes, UA: NEGATIVE
Nitrite, UA: NEGATIVE
Protein, UA: NEGATIVE
Spec Grav, UA: 1.025 (ref 1.010–1.025)
Urobilinogen, UA: NEGATIVE U/dL — AB
pH, UA: 7 (ref 5.0–8.0)

## 2023-10-24 MED ORDER — LABETALOL HCL 300 MG PO TABS: 300.0000 mg | ORAL_TABLET | Freq: Two times a day (BID) | ORAL | 3 refills | Status: DC

## 2023-10-24 NOTE — Progress Notes (Signed)
 PRENATAL VISIT NOTE  Subjective:  Margaret Harvey is a 23 y.o. G2P0010 at [redacted]w[redacted]d being seen today for ongoing prenatal care.  She is currently monitored for the following issues for this high-risk pregnancy and has Generalized anxiety disorder; Herpes simplex vulvovaginitis; Severe episode of recurrent major depressive disorder, without psychotic features (HCC); Migraine headache without aura; Supervision of other normal pregnancy, antepartum; Alpha thalassemia silent carrier; and Gestational hypertension on their problem list.  Patient reports headache - different from yesterday which was severe and different from her migraines. No BV/RUQ pain. Labs normal in MAU yesterday.   Contractions: Irritability. Vag. Bleeding: None.  Movement: Present. Denies leaking of fluid.   The following portions of the patient's history were reviewed and updated as appropriate: allergies, current medications, past family history, past medical history, past social history, past surgical history and problem list.   Objective:    Vitals:   10/24/23 1452 10/24/23 1457  BP: (!) 153/91 (!) 142/92  Pulse: (!) 106 (!) 111  Weight: 176 lb (79.8 kg)     Fetal Status:  Fetal Heart Rate (bpm): 150   Movement: Present    General: Alert, oriented and cooperative. Patient is in no acute distress.  Skin: Skin is warm and dry. No rash noted.   Cardiovascular: Normal heart rate noted  Respiratory: Normal respiratory effort, no problems with respiration noted  Abdomen: Soft, gravid, appropriate for gestational age.  Pain/Pressure: Present     Pelvic: Cervical exam deferred        Extremities: Normal range of motion.  Edema: Trace  Mental Status: Normal mood and affect. Normal behavior. Normal judgment and thought content.   Assessment and Plan:  Pregnancy: G2P0010 at [redacted]w[redacted]d 1. Gestational hypertension, second trimester (Primary) - Patient sent home from MAU on Procardia  60 daily. We discussed may be causing her  headache as this is a common side effect of procardia . Discussed other option for Labetalol but would initially be a twice day medication. She would like to switch. Discussed may also titrate to three times a day. She voices understanding. Discussed the importance of monitoring her blood pressure at home and monitoring symptoms. Reviewed if BV/RUQ pain to go to the hospital. If HA does not resolve with sleep and usual measures by tomorrow then I would recommend going to the hospital. If she has Bps >160/110, I would recommend evaluation in the hospital.  - Reviewed if diagnosed with preeclampsia, then I would recommend inpatient monitoring until indicated for delivery. Reviewed there are measures we can do for fetal benefit if we suspect earlier delivery will be indicated I.e. Magnesium for neuroprotection and BMZ for fetal lung maturity.  - Labs normal yesterday.  - NST reactive today and appropriate for gestational age.  - Continue with weekly monitoring since on medication as well as weekly labs.   2. Herpes simplex vulvovaginitis Prophy no later than 32 weeks since ghtn  3. Supervision of other normal pregnancy, antepartum 28w labs next visit along with ghtn labs. She would like to try 1 hr if possible. Reviewed if abnormal may need to do 3 hour. She would be willing if abnormal.  MOC discussed: None desired  4. Generalized anxiety disorder Lexapro  helping.   5. Alpha thalassemia silent carrier S/p GC on 6/9. FOB testing kit sent by them.   6. Pregnancy with 27 completed weeks gestation   Preterm labor symptoms and general obstetric precautions including but not limited to vaginal bleeding, contractions, leaking of fluid and fetal movement were  reviewed in detail with the patient. Please refer to After Visit Summary for other counseling recommendations.   Return in about 1 week (around 10/31/2023) for HROB VISIT, NST, labs.  Future Appointments  Date Time Provider Department Center   10/30/2023  8:30 AM [facility] NURSE [facility]  10/30/2023 11:10 AM Jan Mcgill, MD [facility]    Lacey Pian, MD

## 2023-10-30 ENCOUNTER — Encounter: Payer: Self-pay | Admitting: Obstetrics and Gynecology

## 2023-10-30 ENCOUNTER — Ambulatory Visit

## 2023-10-30 ENCOUNTER — Ambulatory Visit: Admitting: Obstetrics and Gynecology

## 2023-10-30 VITALS — BP 139/81 | HR 86 | Wt 183.0 lb

## 2023-10-30 DIAGNOSIS — F411 Generalized anxiety disorder: Secondary | ICD-10-CM

## 2023-10-30 DIAGNOSIS — A6004 Herpesviral vulvovaginitis: Secondary | ICD-10-CM | POA: Diagnosis not present

## 2023-10-30 DIAGNOSIS — Z3A28 28 weeks gestation of pregnancy: Secondary | ICD-10-CM | POA: Diagnosis not present

## 2023-10-30 DIAGNOSIS — O99343 Other mental disorders complicating pregnancy, third trimester: Secondary | ICD-10-CM

## 2023-10-30 DIAGNOSIS — Z23 Encounter for immunization: Secondary | ICD-10-CM | POA: Diagnosis not present

## 2023-10-30 DIAGNOSIS — O133 Gestational [pregnancy-induced] hypertension without significant proteinuria, third trimester: Secondary | ICD-10-CM

## 2023-10-30 DIAGNOSIS — Z3480 Encounter for supervision of other normal pregnancy, unspecified trimester: Secondary | ICD-10-CM

## 2023-10-30 DIAGNOSIS — Z1331 Encounter for screening for depression: Secondary | ICD-10-CM | POA: Diagnosis not present

## 2023-10-30 DIAGNOSIS — D563 Thalassemia minor: Secondary | ICD-10-CM

## 2023-10-30 DIAGNOSIS — R195 Other fecal abnormalities: Secondary | ICD-10-CM

## 2023-10-30 DIAGNOSIS — Z3483 Encounter for supervision of other normal pregnancy, third trimester: Secondary | ICD-10-CM

## 2023-10-30 NOTE — Progress Notes (Signed)
 Patient here to pick up lab requisition for 1 hour GTT. Patient given lab requisition to complete at [facility].  Evelia Hipp, RN

## 2023-10-30 NOTE — Progress Notes (Signed)
   PRENATAL VISIT NOTE  Subjective:  Margaret Harvey is a 23 y.o. G2P0010 at [redacted]w[redacted]d being seen today for ongoing prenatal care.  She is currently monitored for the following issues for this high-risk pregnancy and has Generalized anxiety disorder; Herpes simplex vulvovaginitis; Severe episode of recurrent major depressive disorder, without psychotic features (HCC); Migraine headache without aura; Supervision of other normal pregnancy, antepartum; Alpha thalassemia silent carrier; and Gestational hypertension on their problem list.  Patient reports no complaints. Headaches improved on labetalol . Contractions: Not present. Vag. Bleeding: None.  Movement: Present. Denies leaking of fluid.   The following portions of the patient's history were reviewed and updated as appropriate: allergies, current medications, past family history, past medical history, past social history, past surgical history and problem list.   Objective:    Vitals:   10/30/23 1036  BP: 139/81  Pulse: 86  Weight: 183 lb (83 kg)    Fetal Status:  Fetal Heart Rate (bpm): 142   Movement: Present    General: Alert, oriented and cooperative. Patient is in no acute distress.  Skin: Skin is warm and dry. No rash noted.   Cardiovascular: Normal heart rate noted  Respiratory: Normal respiratory effort, no problems with respiration noted  Abdomen: Soft, gravid, appropriate for gestational age.  Pain/Pressure: Absent     Pelvic: Cervical exam deferred        Extremities: Normal range of motion.  Edema: None  Mental Status: Normal mood and affect. Normal behavior. Normal judgment and thought content.   Assessment and Plan:  Pregnancy: G2P0010 at [redacted]w[redacted]d  1. Gestational hypertension, third trimester (Primary) BP stable today Headaches improved/resolved on labetalol  Keep current regimen Reviewed reasons to present to MAU/hospital and reasons would need repeat labwork and/or titrate BP meds - keep taking BP at home - Fetal  nonstress test NST reactive  2. Supervision of other normal pregnancy, antepartum  3. Alpha thalassemia silent carrier  4. [redacted] weeks gestation of pregnancy  5. Herpes simplex vulvovaginitis No symptoms Ppx starting next visit  6. Generalized anxiety disorder Feels stable on lexapro  10 mg  7. Dark stools X 3 days Not straining, saw some burgandy once - likely internal hemorrhoid, try miralax daily to see if improves, should have BM daily - if cont, will need GI referral pp  Preterm labor symptoms and general obstetric precautions including but not limited to vaginal bleeding, contractions, leaking of fluid and fetal movement were reviewed in detail with the patient. Please refer to After Visit Summary for other counseling recommendations.   Return in about 1 week (around 11/06/2023) for high OB, NST.  Future Appointments  Date Time Provider Department Center  11/08/2023 10:30 AM Lacey Pian, MD [facility]  11/12/2023 10:00 AM [facility] PROVIDER 1 [facility]  11/12/2023 10:30 AM [facility] US7 [facility]    Jan Mcgill, MD

## 2023-10-31 ENCOUNTER — Ambulatory Visit: Payer: Self-pay | Admitting: Family Medicine

## 2023-10-31 DIAGNOSIS — Z3480 Encounter for supervision of other normal pregnancy, unspecified trimester: Secondary | ICD-10-CM

## 2023-10-31 LAB — GLUCOSE TOLERANCE, 1 HOUR: Glucose, 1Hr PP: 126 mg/dL (ref 70–199)

## 2023-11-01 ENCOUNTER — Telehealth: Payer: Self-pay

## 2023-11-01 NOTE — Telephone Encounter (Signed)
 RN received mychart message regarding increase in blood pressure yesterday while working with reports of 148/95. Pt reported had to take many breaks because of headache and nausea. Pt reported did improve after resting. Pt reported has not checked blood pressure today, currently no nausea, headache or blurred vision. Pt reported currently in car and unable to check blood pressure. Pt reported good fetal movement. RN reviewed blood pressure values to report advised for patient to check blood pressure when returns home and if any symptoms or elevated blood pressures occur to go to MAU immediately. Patient verbalized understanding.   Evelia Hipp, RN

## 2023-11-02 ENCOUNTER — Encounter (HOSPITAL_COMMUNITY): Payer: Self-pay | Admitting: Obstetrics and Gynecology

## 2023-11-02 ENCOUNTER — Inpatient Hospital Stay (HOSPITAL_COMMUNITY)
Admission: AD | Admit: 2023-11-02 | Discharge: 2023-11-02 | Disposition: A | Discharge status: Home / Self Care | Payer: Self-pay | Attending: Obstetrics and Gynecology | Admitting: Obstetrics and Gynecology

## 2023-11-02 DIAGNOSIS — Z21 Asymptomatic human immunodeficiency virus [HIV] infection status: Secondary | ICD-10-CM

## 2023-11-02 DIAGNOSIS — O132 Gestational [pregnancy-induced] hypertension without significant proteinuria, second trimester: Secondary | ICD-10-CM

## 2023-11-02 DIAGNOSIS — O133 Gestational [pregnancy-induced] hypertension without significant proteinuria, third trimester: Secondary | ICD-10-CM | POA: Diagnosis present

## 2023-11-02 DIAGNOSIS — Z3A28 28 weeks gestation of pregnancy: Secondary | ICD-10-CM | POA: Diagnosis not present

## 2023-11-02 DIAGNOSIS — Z3689 Encounter for other specified antenatal screening: Secondary | ICD-10-CM | POA: Diagnosis not present

## 2023-11-02 DIAGNOSIS — Z3480 Encounter for supervision of other normal pregnancy, unspecified trimester: Secondary | ICD-10-CM

## 2023-11-02 DIAGNOSIS — Z79899 Other long term (current) drug therapy: Secondary | ICD-10-CM | POA: Insufficient documentation

## 2023-11-02 HISTORY — DX: Asymptomatic human immunodeficiency virus (hiv) infection status: Z21

## 2023-11-02 LAB — COMPREHENSIVE METABOLIC PANEL WITH GFR
ALT: 16 U/L (ref 0–44)
AST: 20 U/L (ref 15–41)
Albumin: 3 g/dL — ABNORMAL LOW (ref 3.5–5.0)
Alkaline Phosphatase: 127 U/L — ABNORMAL HIGH (ref 38–126)
Anion gap: 13 (ref 5–15)
BUN: <5 mg/dL — ABNORMAL LOW (ref 6–20)
CO2: 20 mmol/L — ABNORMAL LOW (ref 22–32)
Calcium: 9.4 mg/dL (ref 8.9–10.3)
Chloride: 102 mmol/L (ref 98–111)
Creatinine, Ser: 0.45 mg/dL (ref 0.44–1.00)
GFR, Estimated: >60 mL/min (ref >60)
Glucose, Bld: 94 mg/dL (ref 70–99)
Potassium: 3.5 mmol/L (ref 3.5–5.1)
Sodium: 135 mmol/L (ref 135–145)
Total Bilirubin: 0.2 mg/dL (ref 0.0–1.2)
Total Protein: 6.8 g/dL (ref 6.5–8.1)

## 2023-11-02 LAB — PROTEIN / CREATININE RATIO, URINE
Creatinine, Urine: 14 mg/dL
Total Protein, Urine: <6 mg/dL

## 2023-11-02 LAB — RAPID HIV SCREEN (HIV 1/2 AB+AG)
HIV 1/2 Antibodies: NONREACTIVE
HIV-1 P24 Antigen - HIV24: REACTIVE — AB

## 2023-11-02 LAB — CBC
HCT: 33.4 % — ABNORMAL LOW (ref 36.0–46.0)
Hemoglobin: 11 g/dL — ABNORMAL LOW (ref 12.0–15.0)
MCH: 27.3 pg (ref 26.0–34.0)
MCHC: 32.9 g/dL (ref 30.0–36.0)
MCV: 82.9 fL (ref 80.0–100.0)
Platelets: 290 10*3/uL (ref 150–400)
RBC: 4.03 MIL/uL (ref 3.87–5.11)
RDW: 13.1 % (ref 11.5–15.5)
WBC: 9.7 10*3/uL (ref 4.0–10.5)
nRBC: 0 % (ref 0.0–0.2)

## 2023-11-02 MED ORDER — CYCLOBENZAPRINE HCL 5 MG PO TABS
10.0000 mg | ORAL_TABLET | Freq: Once | ORAL | Status: AC
  Administered 2023-11-02: 10 mg via ORAL
  Filled 2023-11-02: qty 2

## 2023-11-02 MED ORDER — LABETALOL HCL 400 MG PO TABS
400.0000 mg | ORAL_TABLET | Freq: Two times a day (BID) | ORAL | 2 refills | Status: DC
Start: 2023-11-02 — End: 2023-11-07

## 2023-11-02 MED ORDER — PROMETHAZINE HCL 25 MG PO TABS
12.5000 mg | ORAL_TABLET | Freq: Once | ORAL | Status: AC
  Administered 2023-11-02: 12.5 mg via ORAL
  Filled 2023-11-02: qty 1

## 2023-11-02 MED ORDER — LABETALOL HCL 100 MG PO TABS
300.0000 mg | ORAL_TABLET | Freq: Once | ORAL | Status: AC
  Administered 2023-11-02: 300 mg via ORAL
  Filled 2023-11-02: qty 3

## 2023-11-02 MED ORDER — LABETALOL HCL 100 MG PO TABS
100.0000 mg | ORAL_TABLET | Freq: Once | ORAL | Status: AC
  Administered 2023-11-02: 100 mg via ORAL
  Filled 2023-11-02: qty 1

## 2023-11-02 NOTE — MAU Provider Note (Signed)
 History     CSN: [id number]  Arrival date and time: 11/02/23 1835   Event Date/Time   First Provider Initiated Contact with Patient 11/02/23 2144      Chief Complaint  Patient presents with   Hypertension   Margaret Harvey is a 23 y.o. G2P0010 at [redacted]w[redacted]d who receives care at [facility].  She presents today for elevated blood pressures. Patient with known GHTN and is currently on labetalol  300mg  BID.  She states she has been taking her bps at home and they have been 160s/90s.  She states she did not take her evening dose of labetalol .  Patient also reports HA that is unrelieved with tylenol  dosing.  Patient rates her HA a 7/10.  Patient endorses good fetal movement and denies contractions, LOF, and VB.   OB History     Gravida  2   Para      Term      Preterm      AB  1   Living         SAB  0   IAB  1   Ectopic      Multiple      Live Births              Past Medical History:  Diagnosis Date   Depression    GAD (generalized anxiety disorder)    Genital herpes    Hypertension    Seasonal allergies     Past Surgical History:  Procedure Laterality Date   ABCESS DRAINAGE     BREAST REDUCTION SURGERY     DILATION AND EVACUATION  05/2022    Family History  Problem Relation Age of Onset   Sudden death Neg Hx    Hypertension Neg Hx    Hyperlipidemia Neg Hx    Heart attack Neg Hx    Diabetes Neg Hx    Migraines Neg Hx    Seizures Neg Hx    Depression Neg Hx    Anxiety disorder Neg Hx    ADD / ADHD Neg Hx    Autism Neg Hx    Bipolar disorder Neg Hx    Schizophrenia Neg Hx     Social History   Tobacco Use   Smoking status: Never   Smokeless tobacco: Never  Vaping Use   Vaping status: Some Days  Substance Use Topics   Alcohol use: Not Currently    Comment: occ   Drug use: Not Currently    Types: Marijuana    Allergies:  Allergies  Allergen Reactions   Other Anaphylaxis and Other (See Comments)    Almonds    Medications Prior to  Admission  Medication Sig Dispense Refill Last Dose/Taking   acetaminophen  (TYLENOL ) 325 MG tablet Take 650 mg by mouth every 6 (six) hours as needed.   11/02/2023 at  4:00 PM   albuterol  (VENTOLIN  HFA) 108 (90 Base) MCG/ACT inhaler Inhale 1-2 puffs into the lungs every 6 (six) hours as needed for wheezing or shortness of breath. 1 each 2 11/02/2023 at  5:30 PM   aspirin-acetaminophen -caffeine  (EXCEDRIN  MIGRAINE) 250-250-65 MG tablet Take 2 tablets by mouth every 6 (six) hours as needed for headache. (Patient not taking: Reported on 10/30/2023) 30 tablet 1    Doxylamine Succinate, Sleep, (UNISOM PO) Take 1 tablet by mouth as needed (in evening).   11/01/2023 Evening   escitalopram  (LEXAPRO ) 10 MG tablet Take 1 tablet (10 mg total) by mouth daily. 90 tablet 0 11/02/2023 at  4:30 PM  fexofenadine  (ALLEGRA ) 180 MG tablet Take 1 tablet (180 mg total) by mouth daily. 90 tablet 1 11/01/2023 Morning   fluticasone  (FLONASE ) 50 MCG/ACT nasal spray Place 2 sprays into both nostrils daily. 64 mL 2 Past Week   fluticasone  (FLOVENT  HFA) 44 MCG/ACT inhaler Inhale 2 puffs into the lungs 2 (two) times daily. 1 each 12 Past Week   labetalol  (NORMODYNE ) 300 MG tablet Take 1 tablet (300 mg total) by mouth 2 (two) times daily. 60 tablet 3 11/02/2023 at  7:30 AM   Prenatal Vit-Fe Fumarate-FA (PRENATAL VITAMINS PO) Take by mouth.   Past Week   acetaminophen -caffeine  (EXCEDRIN  TENSION HEADACHE) 500-65 MG TABS per tablet Take 1 tablet by mouth as needed. 30 tablet 1    docusate sodium (COLACE) 100 MG capsule Take 100 mg by mouth 2 (two) times daily as needed for mild constipation. (Patient not taking: Reported on 10/30/2023)       Review of Systems  Gastrointestinal:  Positive for nausea. Negative for abdominal pain and vomiting.  Genitourinary:  Negative for difficulty urinating, dysuria, vaginal bleeding and vaginal discharge.  Neurological:  Positive for headaches. Negative for dizziness and light-headedness.   Physical  Exam   Blood pressure (!) 141/89, pulse 77, temperature 97.6 F (36.4 C), temperature source Oral, resp. rate 16, height 5' 2 (1.575 m), weight 83 kg, last menstrual period 03/10/2023, SpO2 100%.  Vitals:   11/02/23 1925 11/02/23 1945 11/02/23 2015 11/02/23 2030  BP: (!) 140/83 (!) 150/90 (!) 153/82 (!) 147/89   11/02/23 2045 11/02/23 2100 11/02/23 2115 11/02/23 2130  BP: (!) 147/83 (!) 142/88 (!) 141/89 (!) 145/90   11/02/23 2145 11/02/23 2215  BP: (!) 152/96 134/79     Physical Exam Vitals and nursing note reviewed.  HENT:     Head: Normocephalic and atraumatic.   Eyes:     Conjunctiva/sclera: Conjunctivae normal.    Cardiovascular:     Rate and Rhythm: Normal rate.  Pulmonary:     Effort: Pulmonary effort is normal. No respiratory distress.  Abdominal:     Tenderness: There is no abdominal tenderness.     Comments: Gravid, Appears AGA   Musculoskeletal:        General: Normal range of motion.     Cervical back: Normal range of motion.   Skin:    General: Skin is warm and dry.   Neurological:     Mental Status: She is alert and oriented to person, place, and time.   Psychiatric:        Mood and Affect: Mood normal.        Behavior: Behavior normal.     Fetal Assessment 150 bpm, Mod Var, -Decels, +Accels Toco: No ctx graphed  MAU Course   Results for orders placed or performed during the hospital encounter of 11/02/23 (from the past 24 hours)  Protein / creatinine ratio, urine     Status: None   Collection Time: 11/02/23  6:44 PM  Result Value Ref Range   Creatinine, Urine 14 mg/dL   Total Protein, Urine <6 mg/dL   Protein Creatinine Ratio        0.00 - 0.15 mg/mg[Cre]  CBC     Status: Abnormal   Collection Time: 11/02/23  7:19 PM  Result Value Ref Range   WBC 9.7 4.0 - 10.5 K/uL   RBC 4.03 3.87 - 5.11 MIL/uL   Hemoglobin 11.0 (L) 12.0 - 15.0 g/dL   HCT 66.5 (L) 63.9 - 53.9 %   MCV 82.9  80.0 - 100.0 fL   MCH 27.3 26.0 - 34.0 pg   MCHC 32.9 30.0  - 36.0 g/dL   RDW 86.8 88.4 - 84.4 %   Platelets 290 150 - 400 K/uL   nRBC 0.0 0.0 - 0.2 %  Comprehensive metabolic panel with GFR     Status: Abnormal   Collection Time: 11/02/23  7:19 PM  Result Value Ref Range   Sodium 135 135 - 145 mmol/L   Potassium 3.5 3.5 - 5.1 mmol/L   Chloride 102 98 - 111 mmol/L   CO2 20 (L) 22 - 32 mmol/L   Glucose, Bld 94 70 - 99 mg/dL   BUN <5 (L) 6 - 20 mg/dL   Creatinine, Ser 9.54 0.44 - 1.00 mg/dL   Calcium 9.4 8.9 - 89.6 mg/dL   Total Protein 6.8 6.5 - 8.1 g/dL   Albumin 3.0 (L) 3.5 - 5.0 g/dL   AST 20 15 - 41 U/L   ALT 16 0 - 44 U/L   Alkaline Phosphatase 127 (H) 38 - 126 U/L   Total Bilirubin 0.2 0.0 - 1.2 mg/dL   GFR, Estimated >39 >39 mL/min   Anion gap 13 5 - 15  Rapid HIV screen (HIV 1/2 Ab+Ag)     Status: Abnormal   Collection Time: 11/02/23  7:19 PM  Result Value Ref Range   HIV-1 P24 Antigen - HIV24 Reactive (A) NON REACTIVE   HIV 1/2 Antibodies NON REACTIVE NON REACTIVE   Interpretation (HIV Ag Ab)      A reactive test result means that HIV 1 p24 antigen has been detected in the specimen. The test result is interpreted as Preliminary Positive for HIV 1 p24 antigen.   No results found.   MDM Physical Exam Labs: CBC, CMP, PC Ratio Measure BPQ15 min EFM Pain Management Antihypertensives Consult Coordination of Follow Up Management of Prescriptions Assessment and Plan   23 year old G2P0010  SIUP at 28.3weeks Cat I FT GHTN Reactive HIV Antigen  -Labs and evening dose of labetalol  given as well as phenergan  for nausea and flexeril  for pain ordered by previous provider .  -Results as above and provider to bedside.  -Provider requests that SO-Donald sit in waiting room while reviewing results and he was agreeable without incident.  -Patient reports improvement in HA and nausea.  -Patient informed of negative PreE labs and recommendation for increasing labetalol  dosing.  -Patient informed of positive HIV antigen.  Further  informed that results preliminary and additional testing needed to confirm findings.  -However, informed that due to seriousness of HIV it is best to discuss next steps and potential for long term disease process. -Support given and patient questions regarding additional testing, infectious disease involvement, and fetal status addressed.  -Patient requesting provider inform SO Donald of results. SO encouraged to get testing immediately to also confirm status. Nancyann was receptive to this feedback and stated he would go to ER or other facility ASAP. -Dr. LOIS Carolin contacted regarding consult with infectious disease and was able to contact Dr. CHRISTELLA. Singh via secure chat. Dr. Dennise added additional labs and will add patient to list for follow up as appropriate. We appreciate her consult.  Harlene LITTIE Duncans MSN, CNM 11/02/2023, 9:44 PM   Reassessment (10:22 PM) -Patient updated on additional labs ordered and plans for follow up as relevant.  -Patient without questions.  Support given.  -Discussed increasing labetalol  to 400mg  BID and will give additional dose of 100mg  now. -Patient to follow up  in office as scheduled, but to monitor blood pressures daily and return with worsening blood pressures and/or new onset HA, visual disturbances, RUQ pain, or edema. -Preterm labor precautions reviewed. -Discharged to home.  Harlene LITTIE Duncans MSN, CNM Advanced Practice Provider, [facility] [employer]    .

## 2023-11-02 NOTE — MAU Note (Signed)
..  Margaret Harvey is a 23 y.o. at [redacted]w[redacted]d here in MAU reporting: high blood pressure reading at home. 160/95 at home reading at 1807. Also has had a HA this afternoon. Took 2 extra strength tylenol  at 1600. Didn't help.   Has been having shortness of breath this afternoon when sitting straight up and with activity. Used albuterol  inhaler at 1730 without relief.    Has been having nausea this afternoon. Zofran  hasn't been helping.   Denies ctx, leaking fluid, bleeding. Endorses +FM.   Pain score: 7/10 HA  Vitals:   11/02/23 1925  BP: (!) 140/83  Pulse: 92  Resp: 16  Temp: 97.6 F (36.4 C)  SpO2: 100%     FHT:150 Lab orders placed from triage:

## 2023-11-03 LAB — RPR: RPR Ser Ql: NONREACTIVE

## 2023-11-04 ENCOUNTER — Encounter: Payer: Self-pay | Admitting: Obstetrics and Gynecology

## 2023-11-04 LAB — HIV-1 RNA QUANT-NO REFLEX-BLD
HIV 1 RNA Quant: <20 {copies}/mL
LOG10 HIV-1 RNA: UNDETERMINED {Log_copies}/mL

## 2023-11-06 LAB — HIV-1/2 AB - DIFFERENTIATION
HIV 1 Ab: NONREACTIVE
HIV 2 Ab: NONREACTIVE
Note: NEGATIVE

## 2023-11-06 LAB — HIV-1/HIV-2 QUALITATIVE RNA
Final Interpretation: NEGATIVE
HIV-1 RNA, Qualitative: NONREACTIVE
HIV-2 RNA, Qualitative: NONREACTIVE

## 2023-11-07 ENCOUNTER — Telehealth: Payer: Self-pay

## 2023-11-07 ENCOUNTER — Inpatient Hospital Stay (HOSPITAL_COMMUNITY)
Admission: AD | Admit: 2023-11-07 | Discharge: 2023-11-07 | Disposition: A | Discharge status: Home / Self Care | Payer: Self-pay | Attending: Obstetrics & Gynecology | Admitting: Obstetrics & Gynecology

## 2023-11-07 ENCOUNTER — Encounter (HOSPITAL_COMMUNITY): Payer: Self-pay | Admitting: Obstetrics & Gynecology

## 2023-11-07 DIAGNOSIS — Z3A29 29 weeks gestation of pregnancy: Secondary | ICD-10-CM

## 2023-11-07 DIAGNOSIS — O133 Gestational [pregnancy-induced] hypertension without significant proteinuria, third trimester: Secondary | ICD-10-CM | POA: Diagnosis not present

## 2023-11-07 DIAGNOSIS — O26893 Other specified pregnancy related conditions, third trimester: Secondary | ICD-10-CM | POA: Insufficient documentation

## 2023-11-07 DIAGNOSIS — O98513 Other viral diseases complicating pregnancy, third trimester: Secondary | ICD-10-CM | POA: Diagnosis not present

## 2023-11-07 DIAGNOSIS — O132 Gestational [pregnancy-induced] hypertension without significant proteinuria, second trimester: Secondary | ICD-10-CM

## 2023-11-07 DIAGNOSIS — Z21 Asymptomatic human immunodeficiency virus [HIV] infection status: Secondary | ICD-10-CM | POA: Diagnosis not present

## 2023-11-07 DIAGNOSIS — B009 Herpesviral infection, unspecified: Secondary | ICD-10-CM | POA: Diagnosis not present

## 2023-11-07 DIAGNOSIS — Z79899 Other long term (current) drug therapy: Secondary | ICD-10-CM | POA: Insufficient documentation

## 2023-11-07 DIAGNOSIS — M545 Low back pain, unspecified: Secondary | ICD-10-CM | POA: Diagnosis not present

## 2023-11-07 DIAGNOSIS — O99013 Anemia complicating pregnancy, third trimester: Secondary | ICD-10-CM | POA: Insufficient documentation

## 2023-11-07 LAB — COMPREHENSIVE METABOLIC PANEL WITH GFR
ALT: 22 U/L (ref 0–44)
AST: 26 U/L (ref 15–41)
Albumin: 2.9 g/dL — ABNORMAL LOW (ref 3.5–5.0)
Alkaline Phosphatase: 127 U/L — ABNORMAL HIGH (ref 38–126)
Anion gap: 8 (ref 5–15)
BUN: 5 mg/dL — ABNORMAL LOW (ref 6–20)
CO2: 23 mmol/L (ref 22–32)
Calcium: 8.8 mg/dL — ABNORMAL LOW (ref 8.9–10.3)
Chloride: 103 mmol/L (ref 98–111)
Creatinine, Ser: 0.38 mg/dL — ABNORMAL LOW (ref 0.44–1.00)
GFR, Estimated: >60 mL/min (ref >60)
Glucose, Bld: 93 mg/dL (ref 70–99)
Potassium: 3.6 mmol/L (ref 3.5–5.1)
Sodium: 134 mmol/L — ABNORMAL LOW (ref 135–145)
Total Bilirubin: 0.2 mg/dL (ref 0.0–1.2)
Total Protein: 6.6 g/dL (ref 6.5–8.1)

## 2023-11-07 LAB — CBC
HCT: 31.8 % — ABNORMAL LOW (ref 36.0–46.0)
Hemoglobin: 10.4 g/dL — ABNORMAL LOW (ref 12.0–15.0)
MCH: 27.3 pg (ref 26.0–34.0)
MCHC: 32.7 g/dL (ref 30.0–36.0)
MCV: 83.5 fL (ref 80.0–100.0)
Platelets: 314 10*3/uL (ref 150–400)
RBC: 3.81 MIL/uL — ABNORMAL LOW (ref 3.87–5.11)
RDW: 13.1 % (ref 11.5–15.5)
WBC: 8.3 10*3/uL (ref 4.0–10.5)
nRBC: 0 % (ref 0.0–0.2)

## 2023-11-07 LAB — PROTEIN / CREATININE RATIO, URINE
Creatinine, Urine: <10 mg/dL
Total Protein, Urine: <6 mg/dL

## 2023-11-07 MED ORDER — CYCLOBENZAPRINE HCL 5 MG PO TABS
10.0000 mg | ORAL_TABLET | Freq: Once | ORAL | Status: AC
  Administered 2023-11-07: 10 mg via ORAL
  Filled 2023-11-07: qty 2

## 2023-11-07 MED ORDER — METOCLOPRAMIDE HCL 10 MG PO TABS
10.0000 mg | ORAL_TABLET | Freq: Once | ORAL | Status: AC
  Administered 2023-11-07: 10 mg via ORAL
  Filled 2023-11-07: qty 1

## 2023-11-07 MED ORDER — LABETALOL HCL 400 MG PO TABS: 400.0000 mg | ORAL_TABLET | Freq: Three times a day (TID) | ORAL | 2 refills | Status: DC

## 2023-11-07 MED ORDER — CYCLOBENZAPRINE HCL 10 MG PO TABS: 10.0000 mg | ORAL_TABLET | Freq: Two times a day (BID) | ORAL | 0 refills | Status: DC | PRN

## 2023-11-07 NOTE — Discharge Instructions (Addendum)
 Please bring your blood pressure cuffs to your appointment tomorrow. If needed, your OB can prescribe a new cuff to ensure accurate readings at home.  Please take your morning dose of labetalol  at least 1 hour before your appointment.  INCREASE your blood pressure medicine frequency to 400 mg THREE TIMES DAILY, 8 hours apart.  LOW HEMOGLOBIN: Low hemoglobin is very common during pregnancy and is usually because iron is low. I would recommend we try to improve you hemoglobin level. We know that improving your pre-delivery hemoglobin reduces your risk of a hemorrhage.   I would recommend you try a supplement called Blood Builder in addition to your prenatal vitamin. It is available via [employer] and also at stores like [employer]. It uses plant based iron and is less prone to causing stomach and bowel upset. Here is a screen shot of the supplement.   Alternatively, you can start a regular iron supplement in addition to your prenatal vitamin. Either ferrous sulfate, ferrous gluconate, or ferrous fumarate. The recommendation is 100 mg every other day. These can all be found over-the-counter.  For some people this can be constipating, so you can also add a daily stool softener (Miralax) if needed.  About 10% of people will have difficulty with iron and it will cause GI upset. To reduce the chance of GI upset, take the supplement with food. Taking it at night so you sleep through potential side effects can also be helpful. If that does not help, taking a liquid formulation with food can help cause less GI symptoms.

## 2023-11-07 NOTE — MAU Provider Note (Signed)
 Chief Complaint:  Hypertension   HPI   None     Margaret Harvey is a 23 y.o. G2P0010 at [redacted]w[redacted]d who presents to maternity admissions for elevated blood pressures. Elevated BP 161/111 at home today around 1630, 2 hours after her labetalol  dose at 1430. She also endorses 7/10 headache since waking up today, unresponsive to Tylenol  1000 mg at 1500. She endorses blurred vision and SOB as well, these have been ongoing for about a week. Denies epigastric pain, vaginal bleeding, leaking of fluid. Endorses good fetal movement. She has a history of BMI >30 that increases the risk of preeclampsia. She also has history of migraine with aura, gHTN. She was initially started on Procardia  XL 30 mg daily on 6/6. After MAU visit, increased dose to Procardia  XL 60 mg daily on 6/10. Changed to labetalol  300 mg BID at prenatal appointment 6/11. This was increased to labetalol  400 mg BID on 6/20. Even since then, blood pressures have been elevated at home. She also endorses low back pain that started today.  Pregnancy Course: Receives care at [facility]. Prenatal records reviewed. Pregnancy complicated by gHTN, HSV, HIV.  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   MRNA negative, c/w false positive screening test (d/w'd ID)     Hypertension    Seasonal allergies    OB History  Gravida Para Term Preterm AB Living  2    1   SAB IAB Ectopic Multiple Live Births  0 1       # Outcome Date GA Lbr Len/2nd Weight Sex Type Anes PTL Lv  2 Current           1 IAB            Past Surgical History:  Procedure Laterality Date   ABCESS DRAINAGE     BREAST REDUCTION SURGERY     DILATION AND EVACUATION  05/2022   Family History  Problem Relation Age of Onset   Sudden death Neg Hx    Hypertension Neg Hx    Hyperlipidemia Neg Hx    Heart attack Neg Hx    Diabetes Neg Hx    Migraines Neg Hx     Seizures Neg Hx    Depression Neg Hx    Anxiety disorder Neg Hx    ADD / ADHD Neg Hx    Autism Neg Hx    Bipolar disorder Neg Hx    Schizophrenia Neg Hx    Social History   Tobacco Use   Smoking status: Never   Smokeless tobacco: Never  Vaping Use   Vaping status: Some Days  Substance Use Topics   Alcohol use: Not Currently    Comment: occ   Drug use: Not Currently    Types: Marijuana   Allergies  Allergen Reactions   Other Anaphylaxis and Other (See Comments)    Almonds   Medications Prior to Admission  Medication Sig Dispense Refill Last Dose/Taking   acetaminophen  (TYLENOL ) 325 MG tablet Take 650 mg by mouth every 6 (six) hours as needed.      acetaminophen -caffeine  (EXCEDRIN  TENSION HEADACHE) 500-65 MG TABS per tablet Take 1 tablet by mouth as needed. 30 tablet 1    albuterol  (VENTOLIN  HFA) 108 (90 Base) MCG/ACT inhaler Inhale 1-2 puffs into the lungs every 6 (six) hours as needed for wheezing or shortness of breath. 1 each 2  docusate sodium (COLACE) 100 MG capsule Take 100 mg by mouth 2 (two) times daily as needed for mild constipation. (Patient not taking: Reported on 10/30/2023)      Doxylamine Succinate, Sleep, (UNISOM PO) Take 1 tablet by mouth as needed (in evening).      escitalopram  (LEXAPRO ) 10 MG tablet Take 1 tablet (10 mg total) by mouth daily. 90 tablet 0    fexofenadine  (ALLEGRA ) 180 MG tablet Take 1 tablet (180 mg total) by mouth daily. 90 tablet 1    fluticasone  (FLONASE ) 50 MCG/ACT nasal spray Place 2 sprays into both nostrils daily. 64 mL 2    fluticasone  (FLOVENT  HFA) 44 MCG/ACT inhaler Inhale 2 puffs into the lungs 2 (two) times daily. 1 each 12    Prenatal Vit-Fe Fumarate-FA (PRENATAL VITAMINS PO) Take by mouth.      [DISCONTINUED] labetalol  400 MG TABS Take 400 mg by mouth 2 (two) times daily. 90 tablet 2     I have reviewed patient's Past Medical Hx, Surgical Hx, Family Hx, Social Hx, medications and allergies.   ROS  Pertinent items noted in  HPI and remainder of comprehensive ROS otherwise negative.   PHYSICAL EXAM  Patient Vitals for the past 24 hrs:  BP Temp Temp src Pulse Resp SpO2 Weight  11/07/23 1920 -- -- -- -- -- 100 % --  11/07/23 1915 127/77 -- -- 83 -- 100 % --  11/07/23 1910 -- -- -- -- -- 100 % --  11/07/23 1905 -- -- -- -- -- 100 % --  11/07/23 1900 121/76 -- -- 86 -- -- --  11/07/23 1855 -- -- -- -- -- 100 % --  11/07/23 1850 -- -- -- -- -- 100 % --  11/07/23 1845 133/75 -- -- 86 -- 100 % --  11/07/23 1840 -- -- -- -- -- 100 % --  11/07/23 1835 -- -- -- -- -- 99 % --  11/07/23 1830 (!) 142/94 -- -- 86 -- 97 % --  11/07/23 1825 -- -- -- -- -- 100 % --  11/07/23 1820 -- -- -- -- -- 100 % --  11/07/23 1815 137/85 -- -- 88 -- 100 % --  11/07/23 1810 -- -- -- -- -- 100 % --  11/07/23 1805 -- -- -- -- -- 100 % --  11/07/23 1800 (!) 140/90 -- -- 85 -- 100 % --  11/07/23 1755 -- -- -- -- -- 100 % --  11/07/23 1750 -- -- -- -- -- 100 % --  11/07/23 1745 134/85 -- -- 97 -- -- --  11/07/23 1732 (!) 140/92 97.8 F (36.6 C) Oral 93 17 100 % 85.2 kg    Constitutional: Well-developed, well-nourished female in no acute distress.  HEENT: atraumatic, normocephalic. Neck has normal ROM. EOM intact. Cardiovascular: normal rate & rhythm, warm and well-perfused. No murmurs, rubs, or gallops. Respiratory: normal effort, no problems with respiration noted. CTA bilaterally. GI: Abd soft, non-tender, non-distended MSK: Extremities nontender, no edema, normal ROM Skin: warm and dry. Acyanotic, no jaundice or pallor. Neurologic: Alert and oriented x 4. No abnormal coordination. Psychiatric: Normal mood. Speech not slurred, not rapid/pressured. Patient is cooperative. GU: no CVA tenderness  Fetal Tracing: Baseline FHR: 155 per minute Fetal heart variability: moderate Fetal Heart Rate accelerations: no Fetal Heart Rate decelerations: none Fetal Non-stress Test: Category I Compared to prior tracing from 11/02/2023 as well,  similar tracings, both Category I  Labs: Results for orders placed or performed during the hospital encounter of 11/07/23 (  from the past 24 hours)  Protein / creatinine ratio, urine     Status: None   Collection Time: 11/07/23  5:46 PM  Result Value Ref Range   Creatinine, Urine <10 mg/dL   Total Protein, Urine <6 mg/dL   Protein Creatinine Ratio        0.00 - 0.15 mg/mg[Cre]  CBC     Status: Abnormal   Collection Time: 11/07/23  6:14 PM  Result Value Ref Range   WBC 8.3 4.0 - 10.5 K/uL   RBC 3.81 (L) 3.87 - 5.11 MIL/uL   Hemoglobin 10.4 (L) 12.0 - 15.0 g/dL   HCT 68.1 (L) 63.9 - 53.9 %   MCV 83.5 80.0 - 100.0 fL   MCH 27.3 26.0 - 34.0 pg   MCHC 32.7 30.0 - 36.0 g/dL   RDW 86.8 88.4 - 84.4 %   Platelets 314 150 - 400 K/uL   nRBC 0.0 0.0 - 0.2 %  Comprehensive metabolic panel with GFR     Status: Abnormal   Collection Time: 11/07/23  6:14 PM  Result Value Ref Range   Sodium 134 (L) 135 - 145 mmol/L   Potassium 3.6 3.5 - 5.1 mmol/L   Chloride 103 98 - 111 mmol/L   CO2 23 22 - 32 mmol/L   Glucose, Bld 93 70 - 99 mg/dL   BUN 5 (L) 6 - 20 mg/dL   Creatinine, Ser 9.61 (L) 0.44 - 1.00 mg/dL   Calcium 8.8 (L) 8.9 - 10.3 mg/dL   Total Protein 6.6 6.5 - 8.1 g/dL   Albumin 2.9 (L) 3.5 - 5.0 g/dL   AST 26 15 - 41 U/L   ALT 22 0 - 44 U/L   Alkaline Phosphatase 127 (H) 38 - 126 U/L   Total Bilirubin 0.2 0.0 - 1.2 mg/dL   GFR, Estimated >39 >39 mL/min   Anion gap 8 5 - 15    Imaging:  No results found.  MDM & MAU COURSE  MDM: High  MAU Course: -Elevated BP, other vital signs within normal limits. -CBC, CMP, urine protein/creatinine ratio for preeclampsia rule out. -Reglan  for nausea and headache, Flexeril  for back pain. -CBC with worsened anemia, Hgb 10.4. Start PO iron supplement. -CMP within normal limits for pregnancy. -Urine protein/creatinine ratio within normal limits. -Back pain improved with Flexeril , prescription sent. -BP has not been severe range during time in  MAU. Discussed with Dr. Fredirick, increase labetalol  to 400 mg three times daily. Bring BP cuff to office visit tomorrow to ensure accuracy.   Orders Placed This Encounter  Procedures   CBC   Comprehensive metabolic panel with GFR   Protein / creatinine ratio, urine   Discharge patient   Meds ordered this encounter  Medications   metoCLOPramide  (REGLAN ) tablet 10 mg   cyclobenzaprine  (FLEXERIL ) tablet 10 mg   Labetalol  HCl 400 MG TABS    Sig: Take 400 mg by mouth 3 (three) times daily. Space out doses so there is one every 8 hours.    Dispense:  90 tablet    Refill:  2   cyclobenzaprine  (FLEXERIL ) 10 MG tablet    Sig: Take 1 tablet (10 mg total) by mouth 2 (two) times daily as needed for muscle spasms.    Dispense:  20 tablet    Refill:  0    ASSESSMENT   1. Gestational hypertension, third trimester   2. Anemia affecting pregnancy in third trimester   3. [redacted] weeks gestation of pregnancy   4. Gestational  hypertension, second trimester     PLAN  Discharge home in stable condition with return precautions.  Increase labetalol  dose to 400 mg TID. Continue ambulatory BP monitoring, bring BP cuff to office visit tomorrow. Given severe range pressures on home BP cuff but no severe range in MAU on two occasions, consider getting new BP cuff, talk with OB about prescription for BP cuff. Start PO iron supplement. Prescription sent for Flexeril  for back pain.   Follow-up Information     [facility] for [facility] at [facility] Follow up.   Specialty: Obstetrics and Gynecology Why: As scheduled for ongoing prenatal care Contact information: [address], [address] [place]  [address] [phone]                 Allergies as of 11/07/2023       Reactions   Other Anaphylaxis, Other (See Comments)   Almonds        Medication List     TAKE these medications    acetaminophen  325 MG tablet Commonly known as: TYLENOL  Take  650 mg by mouth every 6 (six) hours as needed.   albuterol  108 (90 Base) MCG/ACT inhaler Commonly known as: VENTOLIN  HFA Inhale 1-2 puffs into the lungs every 6 (six) hours as needed for wheezing or shortness of breath.   cyclobenzaprine  10 MG tablet Commonly known as: FLEXERIL  Take 1 tablet (10 mg total) by mouth 2 (two) times daily as needed for muscle spasms.   docusate sodium 100 MG capsule Commonly known as: COLACE Take 100 mg by mouth 2 (two) times daily as needed for mild constipation.   escitalopram  10 MG tablet Commonly known as: LEXAPRO  Take 1 tablet (10 mg total) by mouth daily.   Excedrin  Tension Headache 500-65 MG Tabs per tablet Generic drug: acetaminophen -caffeine  Take 1 tablet by mouth as needed.   fexofenadine  180 MG tablet Commonly known as: ALLEGRA  Take 1 tablet (180 mg total) by mouth daily.   fluticasone  44 MCG/ACT inhaler Commonly known as: FLOVENT  HFA Inhale 2 puffs into the lungs 2 (two) times daily.   fluticasone  50 MCG/ACT nasal spray Commonly known as: FLONASE  Place 2 sprays into both nostrils daily.   Labetalol  HCl 400 MG Tabs Take 400 mg by mouth 3 (three) times daily. Space out doses so there is one every 8 hours. What changed:  when to take this additional instructions   PRENATAL VITAMINS PO Take by mouth.   UNISOM PO Take 1 tablet by mouth as needed (in evening).        Joesph DELENA Sear, PA

## 2023-11-07 NOTE — MAU Note (Signed)
..  Margaret Harvey is a 23 y.o. at [redacted]w[redacted]d here in MAU reporting: cramping that comes and goes every 2 minutes that started this evening. She reports that today she's had a headache since she woke up and took 1000mg  of tylenol  at 1500 and it never got any better. She checked her BP at home and it was 161/111, she has blurred vision, and shortness of breath. Denies epigastric pain, VB or LOF. +FM.  Pain score: head- 7  back and abdomen- 8  Vitals:   11/07/23 1732  BP: (!) 140/92  Pulse: 93  Resp: 17  Temp: 97.8 F (36.6 C)  SpO2: 100%     FHT:154 Lab orders placed from triage:   UA

## 2023-11-07 NOTE — Telephone Encounter (Signed)
 RN received mychart message regarding symptoms and blood pressure readings. RN called patient who reported lower abdominal pain( new symptom), constant, started about 20 minutes ago. Headache and nausea present with 2 elevated blood pressure readings. Pt reported good fetal movement. Pt reported tried to sleep earlier and headache woke her up from her sleep. Pt reported taking blood pressure medication as prescribed. RN advised for patient to go to MAU to be evaluated. Patient verbalized understanding and reported she had someone who could take her.   Silvano LELON Piano, RN

## 2023-11-07 NOTE — Telephone Encounter (Signed)
 Pt reported went to [facility] ED last night due to elevated blood pressure at home 161/105. Pt reported was evaluated, monitored and discharged home early this morning. Pt reported headache resolved, no current symptoms. Pt reported good fetal movement and denied any leaking or bleeding. RN advised to continue to monitor blood pressure and symptoms and also when to return to hospital if has a concern. Pt verbalized understanding and plans to attend upcoming scheduled visit on 6/26.  Silvano LELON Piano, RN

## 2023-11-08 ENCOUNTER — Ambulatory Visit: Admitting: Obstetrics and Gynecology

## 2023-11-08 ENCOUNTER — Encounter: Payer: Self-pay | Admitting: Obstetrics and Gynecology

## 2023-11-08 ENCOUNTER — Other Ambulatory Visit (HOSPITAL_COMMUNITY): Payer: Self-pay

## 2023-11-08 VITALS — BP 126/86 | HR 84 | Wt 186.0 lb

## 2023-11-08 DIAGNOSIS — O133 Gestational [pregnancy-induced] hypertension without significant proteinuria, third trimester: Secondary | ICD-10-CM

## 2023-11-08 DIAGNOSIS — O99343 Other mental disorders complicating pregnancy, third trimester: Secondary | ICD-10-CM | POA: Diagnosis not present

## 2023-11-08 DIAGNOSIS — Z3A29 29 weeks gestation of pregnancy: Secondary | ICD-10-CM | POA: Diagnosis not present

## 2023-11-08 DIAGNOSIS — O98313 Other infections with a predominantly sexual mode of transmission complicating pregnancy, third trimester: Secondary | ICD-10-CM

## 2023-11-08 DIAGNOSIS — F332 Major depressive disorder, recurrent severe without psychotic features: Secondary | ICD-10-CM

## 2023-11-08 DIAGNOSIS — Z3480 Encounter for supervision of other normal pregnancy, unspecified trimester: Secondary | ICD-10-CM

## 2023-11-08 DIAGNOSIS — A6004 Herpesviral vulvovaginitis: Secondary | ICD-10-CM

## 2023-11-08 NOTE — Patient Instructions (Signed)
 Headache Prevention Vitamin B12 100 mg daily Magnesium Glycinate 400 mg daily

## 2023-11-08 NOTE — Progress Notes (Signed)
   PRENATAL VISIT NOTE  Subjective:  Margaret Harvey is a 23 y.o. G2P0010 at [redacted]w[redacted]d being seen today for ongoing prenatal care.  She is currently monitored for the following issues for this high-risk pregnancy and has Generalized anxiety disorder; Herpes simplex vulvovaginitis; Severe episode of recurrent major depressive disorder, without psychotic features (HCC); Migraine headache without aura; Supervision of other normal pregnancy, antepartum; Alpha thalassemia silent carrier; and Gestational hypertension on their problem list.  Patient reports intermittent HA but not one now. Fewer HA since stopping procardia .  Contractions: Not present. Vag. Bleeding: None.  Movement: Present. Denies leaking of fluid.   The following portions of the patient's history were reviewed and updated as appropriate: allergies, current medications, past family history, past medical history, past social history, past surgical history and problem list.   Objective:    Vitals:   11/08/23 1051 11/08/23 1126  BP: 139/89 126/86  Pulse: 84 84  Weight: 186 lb (84.4 kg)     Fetal Status:  Fetal Heart Rate (bpm): 145   Movement: Present    General: Alert, oriented and cooperative. Patient is in no acute distress.  Skin: Skin is warm and dry. No rash noted.   Cardiovascular: Normal heart rate noted  Respiratory: Normal respiratory effort, no problems with respiration noted  Abdomen: Soft, gravid, appropriate for gestational age.  Pain/Pressure: Present     Pelvic: Cervical exam deferred        Extremities: Normal range of motion.  Edema: Trace  Mental Status: Normal mood and affect. Normal behavior. Normal judgment and thought content.   NST 150s, reactive and appropriate for GA.  Toco No ctxns  Assessment and Plan:  Pregnancy: G2P0010 at [redacted]w[redacted]d 1. Gestational hypertension, third trimester (Primary) Labs yesterday normal.  Taking Labetalol  400 TID. BP today is normal. Discussed if second agent needed, we could do  Amlodipine.  Forgot cuff - she will bring next visit but reports she did take a second BP at home with a different cuff and had the same result prior to taking Labetalol  TID (Was on BID dosing). Last Labetalol  dose was 9 am this morning.  Growth US  on 6/30.   2. Supervision of other normal pregnancy, antepartum 28 wks labs normal S/p TDAP  3. Severe episode of recurrent major depressive disorder, without psychotic features (HCC) Doing well.   4. Herpes simplex vulvovaginitis Prophy at 32 weeks.   5. Pregnancy with 29 completed weeks gestation   Preterm labor symptoms and general obstetric precautions including but not limited to vaginal bleeding, contractions, leaking of fluid and fetal movement were reviewed in detail with the patient. Please refer to After Visit Summary for other counseling recommendations.   Return in about 1 week (around 11/15/2023) for HROB VISIT, labs, NST.  Future Appointments  Date Time Provider Department Center  11/12/2023 10:00 AM [facility] PROVIDER 1 [facility]  11/12/2023 10:30 AM [facility] US7 [facility]  11/14/2023  8:50 AM Erik Kieth BROCKS, MD [facility]  11/22/2023  9:50 AM Cleatus Moccasin, MD [facility]  11/28/2023 10:30 AM Erik Kieth BROCKS, MD [facility]  12/06/2023  9:50 AM Erik Kieth BROCKS, MD [facility]  12/12/2023 10:30 AM Erik Kieth BROCKS, MD [facility]    Moccasin Cleatus, MD

## 2023-11-12 ENCOUNTER — Ambulatory Visit (HOSPITAL_BASED_OUTPATIENT_CLINIC_OR_DEPARTMENT_OTHER): Admitting: Obstetrics

## 2023-11-12 ENCOUNTER — Ambulatory Visit: Attending: Obstetrics and Gynecology

## 2023-11-12 ENCOUNTER — Other Ambulatory Visit: Payer: Self-pay | Admitting: *Deleted

## 2023-11-12 VITALS — BP 153/89 | HR 92

## 2023-11-12 VITALS — BP 142/92

## 2023-11-12 DIAGNOSIS — B009 Herpesviral infection, unspecified: Secondary | ICD-10-CM | POA: Diagnosis not present

## 2023-11-12 DIAGNOSIS — Z148 Genetic carrier of other disease: Secondary | ICD-10-CM | POA: Insufficient documentation

## 2023-11-12 DIAGNOSIS — O358XX0 Maternal care for other (suspected) fetal abnormality and damage, not applicable or unspecified: Secondary | ICD-10-CM | POA: Diagnosis not present

## 2023-11-12 DIAGNOSIS — O133 Gestational [pregnancy-induced] hypertension without significant proteinuria, third trimester: Secondary | ICD-10-CM

## 2023-11-12 DIAGNOSIS — O132 Gestational [pregnancy-induced] hypertension without significant proteinuria, second trimester: Secondary | ICD-10-CM | POA: Diagnosis present

## 2023-11-12 DIAGNOSIS — O98513 Other viral diseases complicating pregnancy, third trimester: Secondary | ICD-10-CM | POA: Diagnosis not present

## 2023-11-12 DIAGNOSIS — Z3A29 29 weeks gestation of pregnancy: Secondary | ICD-10-CM | POA: Insufficient documentation

## 2023-11-12 DIAGNOSIS — R809 Proteinuria, unspecified: Secondary | ICD-10-CM | POA: Diagnosis not present

## 2023-11-12 DIAGNOSIS — Z3480 Encounter for supervision of other normal pregnancy, unspecified trimester: Secondary | ICD-10-CM | POA: Insufficient documentation

## 2023-11-12 NOTE — Progress Notes (Signed)
 MFM Consult Note  Margaret Harvey is currently at 29 weeks and 6 days.  She was seen due to recently diagnosed gestational hypertension that is treated with labetalol  400 mg 3 times a day.  Her blood pressure today was 153/89.  On today's exam, the overall EFW of 2 pounds 14 ounces measures at the 13th percentile for her gestational age.    There was normal amniotic fluid noted with a total AFI of 12.23 cm.    The following were discussed during today's consultation:  Gestational hypertension  The patient was advised to continue taking labetalol  for management of her blood pressures for the remainder of her pregnancy.    The increased risk of preeclampsia and IUGR due to gestational hypertension was discussed.    She was advised to continue taking a daily baby aspirin (81 mg) for preeclampsia prophylaxis.  Due to gestational hypertension, we will start weekly fetal testing at 32 weeks.    We will reassess the fetal growth in 3 weeks.    The patient was advised that delivery for gestational hypertension is usually recommended at around 37 weeks.    However, delivery prior to 37 weeks may be recommended should her blood pressures be difficult to control or should she develop preeclampsia.    The patient will return in 2 weeks for a BPP.    She stated that all of her questions were answered today.  A total of 20 minutes was spent counseling and coordinating the care for this patient.  Greater than 50% of the time was spent in direct face-to-face contact.

## 2023-11-14 ENCOUNTER — Ambulatory Visit (INDEPENDENT_AMBULATORY_CARE_PROVIDER_SITE_OTHER): Admitting: Obstetrics and Gynecology

## 2023-11-14 VITALS — BP 131/83 | HR 94 | Wt 190.0 lb

## 2023-11-14 DIAGNOSIS — O133 Gestational [pregnancy-induced] hypertension without significant proteinuria, third trimester: Secondary | ICD-10-CM | POA: Diagnosis not present

## 2023-11-14 DIAGNOSIS — F332 Major depressive disorder, recurrent severe without psychotic features: Secondary | ICD-10-CM | POA: Diagnosis not present

## 2023-11-14 DIAGNOSIS — Z3A30 30 weeks gestation of pregnancy: Secondary | ICD-10-CM

## 2023-11-14 DIAGNOSIS — A6004 Herpesviral vulvovaginitis: Secondary | ICD-10-CM

## 2023-11-14 DIAGNOSIS — Z3480 Encounter for supervision of other normal pregnancy, unspecified trimester: Secondary | ICD-10-CM

## 2023-11-14 DIAGNOSIS — F411 Generalized anxiety disorder: Secondary | ICD-10-CM

## 2023-11-14 DIAGNOSIS — D563 Thalassemia minor: Secondary | ICD-10-CM

## 2023-11-14 NOTE — Progress Notes (Signed)
 PRENATAL VISIT NOTE  Subjective:  Margaret Harvey is a 23 y.o. G2P0010 at [redacted]w[redacted]d being seen today for ongoing prenatal care.  She is currently monitored for the following issues for this high-risk pregnancy and has Generalized anxiety disorder; Herpes simplex vulvovaginitis; Severe episode of recurrent major depressive disorder, without psychotic features (HCC); Migraine headache without aura; Supervision of other normal pregnancy, antepartum; Alpha thalassemia silent carrier; and Gestational hypertension on their problem list.  Patient reports doing well overall - has questions about BP. No current symptoms of preE.  Contractions: Not present. Vag. Bleeding: None.  Movement: Present. Denies leaking of fluid.   The following portions of the patient's history were reviewed and updated as appropriate: allergies, current medications, past family history, past medical history, past social history, past surgical history and problem list.   Objective:   Vitals:   11/14/23 0913  BP: 131/83  Pulse: 94  Weight: 190 lb (86.2 kg)    Fetal Status: Fetal Heart Rate (bpm): 135   Movement: Present     General:  Alert, oriented and cooperative. Patient is in no acute distress.  Skin: Skin is warm and dry. No rash noted.   Cardiovascular: Normal heart rate noted  Respiratory: Normal respiratory effort, no problems with respiration noted  Abdomen: Soft, gravid, appropriate for gestational age.  Pain/Pressure: Absent      Assessment and Plan:  Pregnancy: G2P0010 at [redacted]w[redacted]d 1. Supervision of other normal pregnancy, antepartum (Primary) 2. [redacted] weeks gestation of pregnancy Normal third tri labs and tdap given 6/17  3. Gestational hypertension, third trimester -Normotensive today on labetalol  400mg  TID -Started ldASA after MFM appt on 6/30 -Forgot BP cuff today, but BP has been consistent across 3 different cuffs at home -Weekly labs -Weekly antenatal testing to start with MFM at 32 weeks. NST today  appropriate for gestational age -Serial growth US  q3wk - last 6/30 1311g (13%), AC 21%, 12.23, cephalic, posterior. Next scheduled 7/14 - Discussed importance of presenting to MAU for BP 160+/110+, but that occasional 140s/90s is to be expected - Delivery by 37 weeks - will schedule IOL next appt - CBC - Comprehensive metabolic panel with GFR - Protein / creatinine ratio, urine - Fetal nonstress test  4. Herpes simplex vulvovaginitis Ppx to start at 32 weeks  5. Severe episode of recurrent major depressive disorder, without psychotic features (HCC) 6. Generalized anxiety disorder Reports mood is good, satisfied with current lexapro  dose  7. Alpha thalassemia silent carrier S/p genetic counseling, saliva test sent to partner  Please refer to After Visit Summary for other counseling recommendations.   Return in about 1 week (around 11/21/2023) for RN/lab visit for CBC, CMP, & P/C ratio and NST.  Future Appointments  Date Time Provider Department Center  11/22/2023  9:50 AM Cleatus Moccasin, MD [facility]  11/26/2023 10:15 AM [facility] PROVIDER 1 [facility]  11/26/2023 10:30 AM [facility] US4 [facility]  11/28/2023 10:30 AM Erik Kieth BROCKS, MD [facility]  12/04/2023 10:15 AM [facility] PROVIDER 1 [facility]  12/04/2023 10:30 AM [facility] US7 [facility]  12/06/2023  9:50 AM Erik Kieth BROCKS, MD [facility]  12/10/2023 10:15 AM [facility] PROVIDER 1 [facility]  12/10/2023 10:30 AM [facility] US5 [facility]  12/12/2023 10:30 AM Erik Kieth BROCKS, MD [facility]  12/19/2023 10:30 AM Erik Kieth BROCKS, MD [facility]  12/26/2023 10:30 AM Cleatus Moccasin, MD [facility]  01/02/2024 10:30 AM Erik Kieth BROCKS, MD [facility]  01/09/2024 10:30 AM Erik Kieth BROCKS,  MD [facility]   Kieth JAYSON Carolin, MD

## 2023-11-15 ENCOUNTER — Encounter: Payer: Self-pay | Admitting: Obstetrics and Gynecology

## 2023-11-15 LAB — CBC
Hematocrit: 33.1 % — ABNORMAL LOW (ref 34.0–46.6)
Hemoglobin: 10.7 g/dL — ABNORMAL LOW (ref 11.1–15.9)
MCH: 26.9 pg (ref 26.6–33.0)
MCHC: 32.3 g/dL (ref 31.5–35.7)
MCV: 83 fL (ref 79–97)
Platelets: 322 10*3/uL (ref 150–450)
RBC: 3.98 x10E6/uL (ref 3.77–5.28)
RDW: 12.3 % (ref 11.7–15.4)
WBC: 11.7 10*3/uL — ABNORMAL HIGH (ref 3.4–10.8)

## 2023-11-15 LAB — COMPREHENSIVE METABOLIC PANEL WITH GFR
ALT: 16 IU/L (ref 0–32)
AST: 16 IU/L (ref 0–40)
Albumin: 3.9 g/dL — ABNORMAL LOW (ref 4.0–5.0)
Alkaline Phosphatase: 203 IU/L — ABNORMAL HIGH (ref 44–121)
BUN/Creatinine Ratio: 11 (ref 9–23)
BUN: 5 mg/dL — ABNORMAL LOW (ref 6–20)
Bilirubin Total: <0.2 mg/dL (ref 0.0–1.2)
CO2: 20 mmol/L (ref 20–29)
Calcium: 9.1 mg/dL (ref 8.7–10.2)
Chloride: 100 mmol/L (ref 96–106)
Creatinine, Ser: 0.47 mg/dL — ABNORMAL LOW (ref 0.57–1.00)
Globulin, Total: 3 g/dL (ref 1.5–4.5)
Glucose: 89 mg/dL (ref 70–99)
Potassium: 3.8 mmol/L (ref 3.5–5.2)
Sodium: 136 mmol/L (ref 134–144)
Total Protein: 6.9 g/dL (ref 6.0–8.5)
eGFR: 138 mL/min/{1.73_m2} (ref >59)

## 2023-11-15 LAB — PROTEIN / CREATININE RATIO, URINE
Creatinine, Urine: 19.8 mg/dL
Protein, Ur: 7.5 mg/dL
Protein/Creat Ratio: 379 mg/g{creat} — ABNORMAL HIGH (ref 0–200)

## 2023-11-19 ENCOUNTER — Other Ambulatory Visit (HOSPITAL_COMMUNITY): Payer: Self-pay

## 2023-11-19 ENCOUNTER — Encounter (HOSPITAL_COMMUNITY): Payer: Self-pay | Admitting: Family Medicine

## 2023-11-19 ENCOUNTER — Telehealth: Payer: Self-pay

## 2023-11-19 ENCOUNTER — Inpatient Hospital Stay (HOSPITAL_COMMUNITY)
Admission: AD | Admit: 2023-11-19 | Discharge: 2023-11-26 | DRG: 832 | Disposition: A | Discharge status: Home / Self Care | Payer: Self-pay | Attending: Obstetrics & Gynecology | Admitting: Obstetrics & Gynecology

## 2023-11-19 ENCOUNTER — Other Ambulatory Visit: Payer: Self-pay

## 2023-11-19 DIAGNOSIS — Z3689 Encounter for other specified antenatal screening: Secondary | ICD-10-CM

## 2023-11-19 DIAGNOSIS — Z3A31 31 weeks gestation of pregnancy: Secondary | ICD-10-CM | POA: Diagnosis not present

## 2023-11-19 DIAGNOSIS — Z7951 Long term (current) use of inhaled steroids: Secondary | ICD-10-CM | POA: Diagnosis not present

## 2023-11-19 DIAGNOSIS — Z7982 Long term (current) use of aspirin: Secondary | ICD-10-CM

## 2023-11-19 DIAGNOSIS — Z3A30 30 weeks gestation of pregnancy: Secondary | ICD-10-CM | POA: Diagnosis not present

## 2023-11-19 DIAGNOSIS — A6000 Herpesviral infection of urogenital system, unspecified: Secondary | ICD-10-CM | POA: Diagnosis present

## 2023-11-19 DIAGNOSIS — O0990 Supervision of high risk pregnancy, unspecified, unspecified trimester: Secondary | ICD-10-CM

## 2023-11-19 DIAGNOSIS — O98513 Other viral diseases complicating pregnancy, third trimester: Secondary | ICD-10-CM | POA: Diagnosis not present

## 2023-11-19 DIAGNOSIS — Z148 Genetic carrier of other disease: Secondary | ICD-10-CM | POA: Diagnosis not present

## 2023-11-19 DIAGNOSIS — O98313 Other infections with a predominantly sexual mode of transmission complicating pregnancy, third trimester: Secondary | ICD-10-CM | POA: Diagnosis present

## 2023-11-19 DIAGNOSIS — O139 Gestational [pregnancy-induced] hypertension without significant proteinuria, unspecified trimester: Secondary | ICD-10-CM | POA: Diagnosis present

## 2023-11-19 DIAGNOSIS — O133 Gestational [pregnancy-induced] hypertension without significant proteinuria, third trimester: Principal | ICD-10-CM | POA: Diagnosis present

## 2023-11-19 DIAGNOSIS — A6004 Herpesviral vulvovaginitis: Secondary | ICD-10-CM | POA: Diagnosis present

## 2023-11-19 DIAGNOSIS — B009 Herpesviral infection, unspecified: Secondary | ICD-10-CM | POA: Diagnosis not present

## 2023-11-19 DIAGNOSIS — Z79899 Other long term (current) drug therapy: Secondary | ICD-10-CM

## 2023-11-19 DIAGNOSIS — Z3480 Encounter for supervision of other normal pregnancy, unspecified trimester: Secondary | ICD-10-CM

## 2023-11-19 DIAGNOSIS — O1410 Severe pre-eclampsia, unspecified trimester: Secondary | ICD-10-CM | POA: Diagnosis present

## 2023-11-19 DIAGNOSIS — R030 Elevated blood-pressure reading, without diagnosis of hypertension: Secondary | ICD-10-CM | POA: Diagnosis present

## 2023-11-19 DIAGNOSIS — O1493 Unspecified pre-eclampsia, third trimester: Secondary | ICD-10-CM | POA: Diagnosis not present

## 2023-11-19 DIAGNOSIS — O99013 Anemia complicating pregnancy, third trimester: Secondary | ICD-10-CM | POA: Diagnosis present

## 2023-11-19 DIAGNOSIS — O1490 Unspecified pre-eclampsia, unspecified trimester: Secondary | ICD-10-CM | POA: Diagnosis present

## 2023-11-19 LAB — CBC
HCT: 30.1 % — ABNORMAL LOW (ref 36.0–46.0)
Hemoglobin: 9.9 g/dL — ABNORMAL LOW (ref 12.0–15.0)
MCH: 27.7 pg (ref 26.0–34.0)
MCHC: 32.9 g/dL (ref 30.0–36.0)
MCV: 84.1 fL (ref 80.0–100.0)
Platelets: 320 K/uL (ref 150–400)
RBC: 3.58 MIL/uL — ABNORMAL LOW (ref 3.87–5.11)
RDW: 13.2 % (ref 11.5–15.5)
WBC: 11.8 K/uL — ABNORMAL HIGH (ref 4.0–10.5)
nRBC: 0 % (ref 0.0–0.2)

## 2023-11-19 LAB — COMPREHENSIVE METABOLIC PANEL WITH GFR
ALT: 13 U/L (ref 0–44)
AST: 19 U/L (ref 15–41)
Albumin: 2.9 g/dL — ABNORMAL LOW (ref 3.5–5.0)
Alkaline Phosphatase: 149 U/L — ABNORMAL HIGH (ref 38–126)
Anion gap: 9 (ref 5–15)
BUN: 6 mg/dL (ref 6–20)
CO2: 22 mmol/L (ref 22–32)
Calcium: 8.9 mg/dL (ref 8.9–10.3)
Chloride: 102 mmol/L (ref 98–111)
Creatinine, Ser: 0.37 mg/dL — ABNORMAL LOW (ref 0.44–1.00)
GFR, Estimated: >60 mL/min (ref >60)
Glucose, Bld: 94 mg/dL (ref 70–99)
Potassium: 3.6 mmol/L (ref 3.5–5.1)
Sodium: 133 mmol/L — ABNORMAL LOW (ref 135–145)
Total Bilirubin: 0.2 mg/dL (ref 0.0–1.2)
Total Protein: 6.6 g/dL (ref 6.5–8.1)

## 2023-11-19 LAB — PROTEIN / CREATININE RATIO, URINE
Creatinine, Urine: 21 mg/dL
Total Protein, Urine: <6 mg/dL

## 2023-11-19 LAB — TYPE AND SCREEN
ABO/RH(D): O POS
Antibody Screen: NEGATIVE

## 2023-11-19 MED ORDER — CALCIUM CARBONATE ANTACID 500 MG PO CHEW
2.0000 | CHEWABLE_TABLET | ORAL | Status: DC | PRN
  Administered 2023-11-19 – 2023-11-25 (×7): 400 mg via ORAL
  Filled 2023-11-19 (×7): qty 2

## 2023-11-19 MED ORDER — PROMETHAZINE HCL 25 MG/ML IJ SOLN
25.0000 mg | Freq: Once | INTRAVENOUS | Status: AC
  Administered 2023-11-19: 25 mg via INTRAVENOUS
  Filled 2023-11-19: qty 1

## 2023-11-19 MED ORDER — BETAMETHASONE SOD PHOS & ACET 6 (3-3) MG/ML IJ SUSP
12.0000 mg | INTRAMUSCULAR | Status: AC
  Administered 2023-11-19 – 2023-11-20 (×2): 12 mg via INTRAMUSCULAR
  Filled 2023-11-19: qty 5

## 2023-11-19 MED ORDER — NIFEDIPINE 10 MG PO CAPS: 20.0000 mg | ORAL_CAPSULE | ORAL | Status: DC | PRN

## 2023-11-19 MED ORDER — NIFEDIPINE ER OSMOTIC RELEASE 30 MG PO TB24
30.0000 mg | ORAL_TABLET | Freq: Once | ORAL | Status: AC
  Administered 2023-11-19: 30 mg via ORAL
  Filled 2023-11-19: qty 1

## 2023-11-19 MED ORDER — LABETALOL HCL 5 MG/ML IV SOLN: 40.0000 mg | INTRAVENOUS | Status: DC | PRN

## 2023-11-19 MED ORDER — METOCLOPRAMIDE HCL 5 MG/ML IJ SOLN
10.0000 mg | Freq: Once | INTRAMUSCULAR | Status: AC
  Administered 2023-11-20: 10 mg via INTRAVENOUS
  Filled 2023-11-19: qty 2

## 2023-11-19 MED ORDER — NIFEDIPINE 10 MG PO CAPS
10.0000 mg | ORAL_CAPSULE | ORAL | Status: DC | PRN
Start: 2023-11-19 — End: 2023-11-22

## 2023-11-19 MED ORDER — DIPHENHYDRAMINE HCL 25 MG PO CAPS
25.0000 mg | ORAL_CAPSULE | Freq: Once | ORAL | Status: AC
  Administered 2023-11-19: 25 mg via ORAL
  Filled 2023-11-19: qty 1

## 2023-11-19 MED ORDER — CYCLOBENZAPRINE HCL 5 MG PO TABS
10.0000 mg | ORAL_TABLET | Freq: Once | ORAL | Status: AC
  Administered 2023-11-19: 10 mg via ORAL
  Filled 2023-11-19: qty 2

## 2023-11-19 MED ORDER — ACETAMINOPHEN-CAFFEINE 500-65 MG PO TABS
2.0000 | ORAL_TABLET | Freq: Once | ORAL | Status: AC
  Administered 2023-11-19: 2 via ORAL
  Filled 2023-11-19: qty 2

## 2023-11-19 MED ORDER — DOCUSATE SODIUM 100 MG PO CAPS
100.0000 mg | ORAL_CAPSULE | Freq: Every day | ORAL | Status: DC
  Administered 2023-11-20 – 2023-11-26 (×7): 100 mg via ORAL
  Filled 2023-11-19 (×7): qty 1

## 2023-11-19 MED ORDER — LABETALOL HCL 200 MG PO TABS
400.0000 mg | ORAL_TABLET | Freq: Three times a day (TID) | ORAL | Status: DC
  Administered 2023-11-20 – 2023-11-22 (×10): 400 mg via ORAL
  Filled 2023-11-19 (×11): qty 2

## 2023-11-19 MED ORDER — LACTATED RINGERS IV SOLN: 125.0000 mL/h | INTRAVENOUS | Status: AC

## 2023-11-19 MED ORDER — ACETAMINOPHEN 325 MG PO TABS
650.0000 mg | ORAL_TABLET | ORAL | Status: DC | PRN
  Administered 2023-11-20 – 2023-11-25 (×9): 650 mg via ORAL
  Filled 2023-11-19 (×10): qty 2

## 2023-11-19 MED ORDER — NIFEDIPINE 10 MG PO CAPS
20.0000 mg | ORAL_CAPSULE | ORAL | Status: DC | PRN
Start: 2023-11-19 — End: 2023-11-22

## 2023-11-19 MED ORDER — PRENATAL MULTIVITAMIN CH
1.0000 | ORAL_TABLET | Freq: Every day | ORAL | Status: DC
  Administered 2023-11-20 – 2023-11-25 (×7): 1 via ORAL
  Filled 2023-11-19 (×7): qty 1

## 2023-11-19 NOTE — Progress Notes (Deleted)
   PRENATAL VISIT NOTE  Subjective:  Margaret Harvey is a 23 y.o. G2P0010 at 31w***d being seen today for ongoing prenatal care.  She is currently monitored for the following issues for this high-risk pregnancy and has Generalized anxiety disorder; Herpes simplex vulvovaginitis; Severe episode of recurrent major depressive disorder, without psychotic features (HCC); Migraine headache without aura; Supervision of other normal pregnancy, antepartum; Alpha thalassemia silent carrier; and Preeclampsia on their problem list.  Patient reports {[id number]}.   .  .   . Denies leaking of fluid.   The following portions of the patient's history were reviewed and updated as appropriate: allergies, current medications, past family history, past medical history, past social history, past surgical history and problem list.   Objective:    There were no vitals filed for this visit.  Fetal Status:           General: Alert, oriented and cooperative. Patient is in no acute distress.  Skin: Skin is warm and dry. No rash noted.   Cardiovascular: Normal heart rate noted  Respiratory: Normal respiratory effort, no problems with respiration noted  Abdomen: Soft, gravid, appropriate for gestational age.        Pelvic: Cervical exam deferred        Extremities: Normal range of motion.     Mental Status: Normal mood and affect. Normal behavior. Normal judgment and thought content.   Assessment and Plan:  Pregnancy: G2P0010 at 31w***d 1. Pre-eclampsia in third trimester (Primary) Ruled in by proteinuria last week. Recheck labs today. *** BP today ***. On labetalol  400 TID.  Growth US  was 13%ile, normal AC and AFI. Has serial growth Q3 weeks.  Discussed inpatient monitoring at this point due to diagnosis of preeclampsia vs outpatient monitoring. *** Starts BPP on 7/14 with MFM.  Recommend BMZ as well. ***  2. Herpes simplex vulvovaginitis Will start HSV prophy now. ***  3. Supervision of other normal  pregnancy, antepartum   4. Alpha thalassemia silent carrier FOB testing ***  5. Pregnancy with 31 completed weeks gestation   Preterm labor symptoms and general obstetric precautions including but not limited to vaginal bleeding, contractions, leaking of fluid and fetal movement were reviewed in detail with the patient. Please refer to After Visit Summary for other counseling recommendations.   No follow-ups on file.  Future Appointments  Date Time Provider Department Center  11/22/2023  9:50 AM Cleatus Moccasin, MD [facility]  11/26/2023 10:15 AM [facility] PROVIDER 1 [facility]  11/26/2023 10:30 AM [facility] US4 [facility]  11/28/2023 10:30 AM Erik Kieth BROCKS, MD [facility]  12/04/2023 10:15 AM [facility] PROVIDER 1 [facility]  12/04/2023 10:30 AM [facility] US7 [facility]  12/06/2023  9:50 AM Erik Kieth BROCKS, MD [facility]  12/10/2023 10:15 AM [facility] PROVIDER 1 [facility]  12/10/2023 10:30 AM [facility] US5 [facility]  12/12/2023 10:30 AM Erik Kieth BROCKS, MD [facility]  12/19/2023 10:30 AM Erik Kieth BROCKS, MD [facility]  12/26/2023 10:30 AM Cleatus Moccasin, MD [facility]  01/02/2024 10:30 AM Erik Kieth BROCKS, MD [facility]  01/09/2024 10:30 AM Erik Kieth BROCKS, MD [facility]    Moccasin Cleatus, MD

## 2023-11-19 NOTE — MAU Note (Signed)
 Margaret Harvey is a 23 y.o. at [redacted]w[redacted]d here in MAU reporting: having a headache and took her b/p and it was elevated 164/99. Took tylenol  for headache did not help. Taking labetalol  for HTN.  Good fetal movement felt. Reports some blurred vision and floaters denies abd pain.   LMP:  Onset of complaint: this morning Pain score: 7 Vitals:   11/19/23 1738  BP: (!) 151/93  Pulse: (!) 104  Temp: 98.1 F (36.7 C)     FHT: 158  Lab orders placed from triage: [facility]

## 2023-11-19 NOTE — MAU Provider Note (Signed)
 MAU Provider Note  Chief Complaint: Hypertension and Headache     SUBJECTIVE HPI: Margaret Harvey is a 23 y.o. G2P0010 at [redacted]w[redacted]d by early ultrasound who presents to maternity admissions reporting elevated Bps at home, blurry vision, and headache. Pregnancy c/b gestational diabetes, HSV, alpha thalassemia silent carrier. Receives [facility] with [facility].  She took Tylenol  at 1130 today with only transient benefit of her headache.  When she sat up Her headache was just as intense.  She took Flexeril  at noon today for muscle aches in her trapezius and is not certain of any benefit as she fell asleep shortly after.  She also notes nausea but has not taken anything for it.  No vomiting.  Good fetal movement.  She does not report any contractions.   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   MRNA negative, c/w false positive screening test (d/w'd ID)     Hypertension    Seasonal allergies    Past Surgical History:  Procedure Laterality Date   ABCESS DRAINAGE     BREAST REDUCTION SURGERY     DILATION AND EVACUATION  05/2022   Social History   Socioeconomic History   Marital status: Single    Spouse name: Not on file   Number of children: Not on file   Years of education: Not on file   Highest education level: Not on file  Occupational History   Not on file  Tobacco Use   Smoking status: Never   Smokeless tobacco: Never  Vaping Use   Vaping status: Some Days  Substance and Sexual Activity   Alcohol use: Not Currently    Comment: occ   Drug use: Not Currently    Types: Marijuana   Sexual activity: Yes    Partners: Male    Birth control/protection: None  Other Topics Concern   Not on file  Social History Narrative   Margaret Harvey will be entering the 11th grade at [employer]; she does well in school. She lives with her mother and sister. She enjoys watching TV, dancing, and running.        No therapy or counseling.       Head injury: January hit head on the side of the brick house.    Social Drivers of Corporate investment banker Strain: Not on file  Food Insecurity: Low Risk  (07/12/2023)   Received from [facility]   Hunger Vital Sign    Within the past 12 months, you worried that your food would run out before you got money to buy more: Never true    Within the past 12 months, the food you bought just didn't last and you didn't have money to get more. : Never true  Transportation Needs: No Transportation Needs (07/12/2023)   Received from [facility]    In the past 12 months, has lack of reliable transportation kept you from medical appointments, meetings, work or from getting things needed for daily living? : No  Physical Activity: Not on file  Stress: Not on file  Social Connections: Unknown (11/07/2022)   Received from [facility]   Social Network    Social Network: Not on file  Intimate Partner Violence: Not At Risk (11/07/2023)   Received from [facility]   Humiliation, Afraid, Rape, and Kick questionnaire    Within the last year, have you  been afraid of your partner or ex-partner?: No    Within the last year, have you been humiliated or emotionally abused in other ways by your partner or ex-partner?: No    Within the last year, have you been kicked, hit, slapped, or otherwise physically hurt by your partner or ex-partner?: No    Within the last year, have you been raped or forced to have any kind of sexual activity by your partner or ex-partner?: No   No current facility-administered medications on file prior to encounter.   Current Outpatient Medications on File Prior to Encounter  Medication Sig Dispense Refill   acetaminophen  (TYLENOL ) 325 MG tablet Take 500 mg by mouth every 6 (six) hours as needed.     albuterol  (VENTOLIN  HFA) 108 (90 Base) MCG/ACT inhaler Inhale 1-2 puffs into the lungs every 6 (six) hours as needed for  wheezing or shortness of breath. 1 each 2   cyclobenzaprine  (FLEXERIL ) 10 MG tablet Take 1 tablet (10 mg total) by mouth 2 (two) times daily as needed for muscle spasms. 20 tablet 0   Doxylamine  Succinate, Sleep, (UNISOM  PO) Take 1 tablet by mouth as needed (in evening).     escitalopram  (LEXAPRO ) 10 MG tablet Take 1 tablet (10 mg total) by mouth daily. 90 tablet 0   fexofenadine  (ALLEGRA ) 180 MG tablet Take 1 tablet (180 mg total) by mouth daily. 90 tablet 1   fluticasone  (FLONASE ) 50 MCG/ACT nasal spray Place 2 sprays into both nostrils daily. 64 mL 2   fluticasone  (FLOVENT  HFA) 44 MCG/ACT inhaler Inhale 2 puffs into the lungs 2 (two) times daily. 1 each 12   Labetalol  HCl 400 MG TABS Take 400 mg by mouth 3 (three) times daily. Space out doses so there is one every 8 hours. 90 tablet 2   Prenatal Vit-Fe Fumarate-FA (PRENATAL VITAMINS PO) Take by mouth.     acetaminophen -caffeine  (EXCEDRIN  TENSION HEADACHE) 500-65 MG TABS per tablet Take 1 tablet by mouth as needed. 30 tablet 1   docusate sodium  (COLACE) 100 MG capsule Take 100 mg by mouth 2 (two) times daily as needed for mild constipation.     Allergies  Allergen Reactions   Other Anaphylaxis and Other (See Comments)    Almonds    ROS:  Pertinent positives/negatives listed above.  I have reviewed patient's Past Medical Hx, Surgical Hx, Family Hx, Social Hx, medications and allergies.   Physical Exam  Patient Vitals for the past 24 hrs:  BP Temp Pulse SpO2 Height Weight  11/19/23 1946 (!) 165/106 -- 93 -- -- --  11/19/23 1932 (!) 149/93 -- 94 -- -- --  11/19/23 1916 (!) 140/91 -- 91 -- -- --  11/19/23 1901 (!) 155/97 -- 95 -- -- --  11/19/23 1850 -- -- -- 100 % -- --  11/19/23 1845 (!) 154/94 -- 96 100 % -- --  11/19/23 1830 (!) 151/93 -- 98 100 % -- --  11/19/23 1820 -- -- -- 100 % -- --  11/19/23 1815 (!) 153/94 -- 100 100 % -- --  11/19/23 1810 -- -- -- 100 % -- --  11/19/23 1805 (!) 153/93 -- 98 100 % -- --  11/19/23 1800  -- -- -- 100 % -- --  11/19/23 1755 -- -- -- 100 % -- --  11/19/23 1738 (!) 151/93 98.1 F (36.7 C) (!) 104 -- 5' 2 (1.575 m) 87.5 kg   Constitutional: Well-developed, well-nourished female in no acute distress  Cardiovascular: Borderline tachycardic, no murmurs rubs  or gallops Respiratory: normal effort, clear to auscultation bilaterally GI: Abd soft, non-tender MS: Extremities nontender, no edema, capillary refill less than 2 seconds Neurologic: Alert and oriented x 4    FHT:  Baseline 150 , moderate variability, accelerations present, no definite decelerations Contractions: none  LAB RESULTS Results for orders placed or performed during the hospital encounter of 11/19/23 (from the past 24 hours)  Protein / creatinine ratio, urine     Status: None   Collection Time: 11/19/23  4:41 PM  Result Value Ref Range   Creatinine, Urine 21 mg/dL   Total Protein, Urine <6 mg/dL   Protein Creatinine Ratio        0.00 - 0.15 mg/mg[Cre]  Comprehensive metabolic panel     Status: Abnormal   Collection Time: 11/19/23  6:40 PM  Result Value Ref Range   Sodium 133 (L) 135 - 145 mmol/L   Potassium 3.6 3.5 - 5.1 mmol/L   Chloride 102 98 - 111 mmol/L   CO2 22 22 - 32 mmol/L   Glucose, Bld 94 70 - 99 mg/dL   BUN 6 6 - 20 mg/dL   Creatinine, Ser 9.62 (L) 0.44 - 1.00 mg/dL   Calcium  8.9 8.9 - 10.3 mg/dL   Total Protein 6.6 6.5 - 8.1 g/dL   Albumin 2.9 (L) 3.5 - 5.0 g/dL   AST 19 15 - 41 U/L   ALT 13 0 - 44 U/L   Alkaline Phosphatase 149 (H) 38 - 126 U/L   Total Bilirubin 0.2 0.0 - 1.2 mg/dL   GFR, Estimated >39 >39 mL/min   Anion gap 9 5 - 15  CBC     Status: Abnormal   Collection Time: 11/19/23  6:40 PM  Result Value Ref Range   WBC 11.8 (H) 4.0 - 10.5 K/uL   RBC 3.58 (L) 3.87 - 5.11 MIL/uL   Hemoglobin 9.9 (L) 12.0 - 15.0 g/dL   HCT 69.8 (L) 63.9 - 53.9 %   MCV 84.1 80.0 - 100.0 fL   MCH 27.7 26.0 - 34.0 pg   MCHC 32.9 30.0 - 36.0 g/dL   RDW 86.7 88.4 - 84.4 %   Platelets 320 150 -  400 K/uL   nRBC 0.0 0.0 - 0.2 %    O/Positive/-- (01/14 1539)  IMAGING US  MFM OB FOLLOW UP Result Date: 11/12/2023 ----------------------------------------------------------------------  OBSTETRICS REPORT                       (Signed Final 11/12/2023 12:39 pm) ---------------------------------------------------------------------- Patient Info  ID #:       [id number]                          D.O.B.:  11-Dec-2000 (22 yrs)(F)  Name:       Margaret Harvey                   Visit Date: 11/12/2023 10:04 am ---------------------------------------------------------------------- Performed By  Attending:        Steffan Keys MD         Ref. Address:     [address]  [place], [place]  Performed By:     Erminio Gentry            [facility]  Referred By:      RANEE [place] ---------------------------------------------------------------------- Orders  #  Description                           Code        Ordered By  1  US  MFM OB FOLLOW UP                   23183.98    VINA SOLIAN ----------------------------------------------------------------------  #  Order #                     Accession #                Episode #  1  [id number]                   [id number]                 [id number] ---------------------------------------------------------------------- Indications  Gestational hypertension without significant   O13.3  proteinuria, third trimester (Labetalol )  Herpes simplex virus (HSV)                     O98.519 B00.9  Echogenic intracardiac focus of the heart      O35.8XX0  (EIF)  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)    Z14.8  LR NIPS - Female, Negative AFP, GTT WNL  [redacted] weeks gestation of pregnancy                 Z3A.29 ---------------------------------------------------------------------- Fetal Evaluation  Num Of Fetuses:         1  Fetal Heart Rate(bpm):  145  Cardiac Activity:       Observed  Presentation:           Cephalic  Placenta:               Posterior  P. Cord Insertion:      Previously seen  Amniotic Fluid  AFI FV:      Within normal limits  AFI Sum(cm)     %Tile       Largest Pocket(cm)  12.23  31          3.89  RUQ(cm)       RLQ(cm)       LUQ(cm)        LLQ(cm)  3.23          3.61          1.5            3.89 ---------------------------------------------------------------------- Biometry  BPD:      74.8  mm     G. Age:  30w 0d         42  %    CI:        75.88   %    70 - 86                                                          FL/HC:      19.7   %    19.2 - 21.4  HC:      272.2  mm     G. Age:  29w 5d         14  %    HC/AC:      1.10        0.99 - 1.21  AC:      247.9  mm     G. Age:  29w 0d         21  %    FL/BPD:     71.7   %    71 - 87  FL:       53.6  mm     G. Age:  28w 3d          7  %    FL/AC:      21.6   %    20 - 24  HUM:        49  mm     G. Age:  28w 6d         29  %  LV:          2  mm  Est. FW:    1311  gm    2 lb 14 oz      13  % ---------------------------------------------------------------------- OB History  Gravidity:    2         Term:   0        Prem:   0        SAB:   0  TOP:          1       Ectopic:  0        Living: 0 ---------------------------------------------------------------------- Gestational Age  LMP:           35w 2d        Date:  03/10/23                 EDD:   12/15/23  U/S Today:     29w 2d                                        EDD:   01/26/24  Best:          29w 6d  Det. By:  U/S C R L  (05/29/23)    EDD:   01/22/24 ---------------------------------------------------------------------- Anatomy  Cranium:               Previously seen        Aortic Arch:            Previously seen  Cavum:                 Previously seen        Ductal Arch:            Previously  seen  Ventricles:            Appears normal         Diaphragm:              Appears normal  Choroid Plexus:        Previously seen        Stomach:                Appears normal, left                                                                        sided  Cerebellum:            Previously seen        Abdomen:                Previously seen  Posterior Fossa:       Previously seen        Abdominal Wall:         Previously seen  Face:                  Orbits and profile     Cord Vessels:           Previously seen                         previously seen  Lips:                  Previously seen        Kidneys:                Appear normal  Thoracic:              Previously seen        Bladder:                Appears normal  Heart:                 Appears normal         Spine:                  Previously seen                         (4CH, axis, and                         situs)  RVOT:                  Previously seen  Upper Extremities:      Previously seen  LVOT:                  Previously seen        Lower Extremities:      Previously seen ---------------------------------------------------------------------- Cervix Uterus Adnexa  Cervix  Not visualized (advanced GA >24wks)  Uterus  No abnormality visualized.  Right Ovary  Size(cm)     2.47   x   2.35   x  1.64      Vol(ml): 4.98  Within normal limits.  Left Ovary  Within normal limits.  Cul De Sac  No free fluid seen.  Adnexa  No adnexal mass visualized ---------------------------------------------------------------------- Comments  Margaret Harvey is currently at 29 weeks and 6 days.  She was  seen due to recently diagnosed gestational hypertension that  is treated with labetalol  400 mg 3 times a day.  Her blood  pressure today was 153/89.  On today's exam, the overall EFW of 2 pounds 14 ounces  measures at the 13th percentile for her gestational age.  There was normal amniotic fluid noted with a total AFI of  12.23 cm.  The following were discussed during  today's consultation:  Gestational hypertension  The patient was advised to continue taking labetalol  for  management of her blood pressures for the remainder of her  pregnancy.  The increased risk of preeclampsia and IUGR due to  gestational hypertension was discussed.  She was advised to continue taking a daily baby aspirin  (81  mg) for preeclampsia prophylaxis.  Due to gestational hypertension, we will start weekly fetal  testing at 32 weeks.  We will reassess the fetal growth in 3 weeks.  The patient was advised that delivery for gestational  hypertension is usually recommended at around 37 weeks.  However, delivery prior to 37 weeks may be recommended  should her blood pressures be difficult to control or should  she develop preeclampsia.  The patient will return in 2 weeks for a BPP.  She stated that all of her questions were answered today.  A total of 20 minutes was spent counseling and coordinating  the care for this patient.  Greater than 50% of the time was  spent in direct face-to-face contact. ----------------------------------------------------------------------                   Steffan Keys, MD Electronically Signed Final Report   11/12/2023 12:39 pm ----------------------------------------------------------------------    MAU Management/MDM: Orders Placed This Encounter  Procedures   Comprehensive metabolic panel   CBC   Protein / creatinine ratio, urine   Notify physician (specify) Confirmatory reading of BP> 160/110 15 minutes later   Apply Hypertensive Disorders of Pregnancy Care Plan   Measure blood pressure    Meds ordered this encounter  Medications   AND Linked Order Group    NIFEdipine  (PROCARDIA ) capsule 10 mg    NIFEdipine  (PROCARDIA ) capsule 20 mg    NIFEdipine  (PROCARDIA ) capsule 20 mg    labetalol  (NORMODYNE ) injection 40 mg   promethazine  (PHENERGAN ) 25 mg in lactated ringers  1,000 mL infusion   acetaminophen -caffeine  (EXCEDRIN  TENSION HEADACHE) 500-65 MG per tablet  2 tablet   cyclobenzaprine  (FLEXERIL ) tablet 10 mg   NIFEdipine  (PROCARDIA -XL/NIFEDICAL-XL) 24 hr tablet 30 mg     Available prenatal records reviewed.  -PreE labs ordered, wnl -Headache management included Excedrin  tension 1000/130mg , Flexeril  10 mg, 1 L bolus LR - Nausea management included IV piggyback Phenergan  25 mg - Severe range BP  during encounter, non-sustained, ordered procardia  XL 30 mg - headache not yet resolved despite above interventions, discussed with Dr. Abigail, will admit for observation   ASSESSMENT 1. Pre-eclampsia in third trimester   2. [redacted] weeks gestation of pregnancy   3. NST (non-stress test) reactive     PLAN Admit to antepartum unit.   Follow-up Information     [facility]              Follow up.   Why: for routinely scheduled OB appointment Contact information: [place]                Reagan Alena Morrison, MD Family Medicine PGY1 11/19/2023  7:53 PM   ATTENDING ATTESTATION  I have seen and examined this patient and agree with the above documentation in the resident's note except as below.  I have edited the above note for accuracy and clarity Admit to [facility], see same day H&P  Donnice CHRISTELLA Carolus, MD/[facility] [employer] (Faculty Practice) 11/19/2023, 9:02 PM

## 2023-11-19 NOTE — Telephone Encounter (Signed)
 Pt sent a mychart message with elevated blood pressure and symptoms. RN called patient who reported had 2 elevated blood pressures at work (156/88 and 149/100) with severe headache and blurred vision, went home from work, took Tylenol , headache improved, now rating 2/10, took blood pressure at home while on the phone with RN, 160/77, reported good fetal movement. RN advised for patient to be evaluated at MAU. Patient verbalized understanding and reported she could be there within the hour.  Silvano LELON Piano, RN

## 2023-11-19 NOTE — H&P (Signed)
 FACULTY PRACTICE ANTEPARTUM ADMISSION HISTORY AND PHYSICAL NOTE   History of Present Illness: HPI: Margaret Harvey is a 23 y.o. G2P0010 at [redacted]w[redacted]d by early ultrasound who presents to maternity admissions reporting elevated Bps at home, blurry vision, nausea, and headache. Pregnancy c/b pre-eclampsia without severe features, HSV, alpha thalassemia silent carrier. Receives [facility] with [facility].  Margaret Harvey took Tylenol  at 1130 today with only transient benefit of her headache.  When Margaret Harvey sat up Her headache was just as intense.  Margaret Harvey took Flexeril  at noon today for muscle aches in her trapezius and is not certain of any benefit as Margaret Harvey fell asleep shortly after.  Margaret Harvey also notes nausea but has not taken anything for it.  No vomiting.  Good fetal movement.  Margaret Harvey does not report any contractions.    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   MRNA negative, c/w false positive screening test (d/w'd ID)     Hypertension    Seasonal allergies     Past Surgical History:  Procedure Laterality Date   ABCESS DRAINAGE     BREAST REDUCTION SURGERY     DILATION AND EVACUATION  05/2022    OB History  Gravida Para Term Preterm AB Living  2    1   SAB IAB Ectopic Multiple Live Births  0 1       # Outcome Date GA Lbr Len/2nd Weight Sex Type Anes PTL Lv  2 Current           1 IAB             Social History   Socioeconomic History   Marital status: Single    Spouse name: Not on file    Number of children: Not on file   Years of education: Not on file   Highest education level: Not on file  Occupational History   Not on file  Tobacco Use   Smoking status: Never   Smokeless tobacco: Never  Vaping Use   Vaping status: Some Days  Substance and Sexual Activity   Alcohol use: Not Currently    Comment: occ   Drug use: Not Currently    Types: Marijuana   Sexual activity: Yes    Partners: Male    Birth control/protection: None  Other Topics Concern   Not on file  Social History Narrative   Margaret Harvey will be entering the 11th grade at [employer]; Margaret Harvey does well in school. Margaret Harvey lives with her mother and sister. Margaret Harvey enjoys watching TV, dancing, and running.       No therapy or counseling.       Head injury: January hit head on the side of the brick house.    Social Drivers of Corporate investment banker Strain: Not on file  Food Insecurity: Low Risk  (07/12/2023)   Received from [facility]   Hunger Vital Sign    Within the past 12 months, you worried that your food would run out before you got money  to buy more: Never true    Within the past 12 months, the food you bought just didn't last and you didn't have money to get more. : Never true  Transportation Needs: No Transportation Needs (07/12/2023)   Received from [facility]   Transportation    In the past 12 months, has lack of reliable transportation kept you from medical appointments, meetings, work or from getting things needed for daily living? : No  Physical Activity: Not on file  Stress: Not on file  Social Connections: Unknown (11/07/2022)   Received from [facility]   Social Network    Social Network: Not on file    Family History  Problem Relation Age of Onset   Sudden death Neg Hx    Hypertension Neg Hx    Hyperlipidemia Neg Hx    Heart attack Neg Hx    Diabetes Neg Hx    Migraines Neg Hx    Seizures Neg Hx    Depression Neg Hx    Anxiety disorder Neg Hx    ADD / ADHD Neg Hx    Autism Neg  Hx    Bipolar disorder Neg Hx    Schizophrenia Neg Hx     Allergies  Allergen Reactions   Other Anaphylaxis and Other (See Comments)    Almonds    Medications Prior to Admission  Medication Sig Dispense Refill Last Dose/Taking   acetaminophen  (TYLENOL ) 325 MG tablet Take 500 mg by mouth every 6 (six) hours as needed.   11/19/2023 Noon   albuterol  (VENTOLIN  HFA) 108 (90 Base) MCG/ACT inhaler Inhale 1-2 puffs into the lungs every 6 (six) hours as needed for wheezing or shortness of breath. 1 each 2 Past Week   aspirin  EC 81 MG tablet Take 81 mg by mouth daily. Swallow whole.   11/18/2023 Evening   cyclobenzaprine  (FLEXERIL ) 10 MG tablet Take 1 tablet (10 mg total) by mouth 2 (two) times daily as needed for muscle spasms. 20 tablet 0 11/19/2023 Noon   Doxylamine  Succinate, Sleep, (UNISOM  PO) Take 1 tablet by mouth as needed (in evening).   Past Week   escitalopram  (LEXAPRO ) 10 MG tablet Take 1 tablet (10 mg total) by mouth daily. 90 tablet 0 11/19/2023 Noon   fexofenadine  (ALLEGRA ) 180 MG tablet Take 1 tablet (180 mg total) by mouth daily. 90 tablet 1 11/18/2023 Evening   fluticasone  (FLONASE ) 50 MCG/ACT nasal spray Place 2 sprays into both nostrils daily. 64 mL 2 Past Week   fluticasone  (FLOVENT  HFA) 44 MCG/ACT inhaler Inhale 2 puffs into the lungs 2 (two) times daily. 1 each 12 Past Week   Labetalol  HCl 400 MG TABS Take 400 mg by mouth 3 (three) times daily. Space out doses so there is one every 8 hours. 90 tablet 2 11/19/2023 at  5:10 PM   Prenatal Vit-Fe Fumarate-FA (PRENATAL VITAMINS PO) Take by mouth.   11/18/2023 Evening   acetaminophen -caffeine  (EXCEDRIN  TENSION HEADACHE) 500-65 MG TABS per tablet Take 1 tablet by mouth as needed. 30 tablet 1    docusate sodium  (COLACE) 100 MG capsule Take 100 mg by mouth 2 (two) times daily as needed for mild constipation.   Unknown    Review of Systems Pertinent positives and negative per HPI, all others reviewed and negative   Vitals:  BP (!) 157/101    Pulse 88   Temp 98.1 F (36.7 C)   Ht 5' 2 (1.575 m)   Wt 87.5 kg   LMP 03/10/2023   SpO2 100%  BMI 35.30 kg/m  Physical Exam Vitals reviewed.  Constitutional:      General: Margaret Harvey is not in acute distress.    Appearance: Margaret Harvey is well-developed. Margaret Harvey is not diaphoretic.  Cardiovascular:     Rate and Rhythm: Normal rate and regular rhythm.     Heart sounds: Normal heart sounds. No murmur heard. Pulmonary:     Effort: Pulmonary effort is normal. No respiratory distress.     Breath sounds: Normal breath sounds. No wheezing or rales.  Abdominal:     General: Bowel sounds are normal. There is no distension.     Palpations: Abdomen is soft.     Tenderness: There is no abdominal tenderness. There is no guarding or rebound.  Skin:    General: Skin is warm and dry.  Neurological:     Mental Status: Margaret Harvey is alert.     Coordination: Coordination normal.      FHT:  Baseline 150 , moderate variability, accelerations present, no definite decelerations Contractions: none NST: reactive  Labs:  Results for orders placed or performed during the hospital encounter of 11/19/23 (from the past 24 hours)  Protein / creatinine ratio, urine   Collection Time: 11/19/23  4:41 PM  Result Value Ref Range   Creatinine, Urine 21 mg/dL   Total Protein, Urine <6 mg/dL   Protein Creatinine Ratio        0.00 - 0.15 mg/mg[Cre]  Comprehensive metabolic panel   Collection Time: 11/19/23  6:40 PM  Result Value Ref Range   Sodium 133 (L) 135 - 145 mmol/L   Potassium 3.6 3.5 - 5.1 mmol/L   Chloride 102 98 - 111 mmol/L   CO2 22 22 - 32 mmol/L   Glucose, Bld 94 70 - 99 mg/dL   BUN 6 6 - 20 mg/dL   Creatinine, Ser 9.62 (L) 0.44 - 1.00 mg/dL   Calcium  8.9 8.9 - 10.3 mg/dL   Total Protein 6.6 6.5 - 8.1 g/dL   Albumin 2.9 (L) 3.5 - 5.0 g/dL   AST 19 15 - 41 U/L   ALT 13 0 - 44 U/L   Alkaline Phosphatase 149 (H) 38 - 126 U/L   Total Bilirubin 0.2 0.0 - 1.2 mg/dL   GFR, Estimated >39 >39 mL/min   Anion gap  9 5 - 15  CBC   Collection Time: 11/19/23  6:40 PM  Result Value Ref Range   WBC 11.8 (H) 4.0 - 10.5 K/uL   RBC 3.58 (L) 3.87 - 5.11 MIL/uL   Hemoglobin 9.9 (L) 12.0 - 15.0 g/dL   HCT 69.8 (L) 63.9 - 53.9 %   MCV 84.1 80.0 - 100.0 fL   MCH 27.7 26.0 - 34.0 pg   MCHC 32.9 30.0 - 36.0 g/dL   RDW 86.7 88.4 - 84.4 %   Platelets 320 150 - 400 K/uL   nRBC 0.0 0.0 - 0.2 %    Imaging Studies: US  MFM OB FOLLOW UP Result Date: 11/12/2023 ----------------------------------------------------------------------  OBSTETRICS REPORT                       (Signed Final 11/12/2023 12:39 pm) ---------------------------------------------------------------------- Patient Info  ID #:       [id number]                          D.O.B.:  [birth year] (22 yrs)(F)  Name:       Margaret Harvey  Visit Date: 11/12/2023 10:04 am ---------------------------------------------------------------------- Performed By  Attending:        Steffan Keys MD         Ref. Address:     [address] [place]  Performed By:     Erminio Gentry            [facility]  Referred By:      RANEE Lofts ---------------------------------------------------------------------- Orders  #  Description                           Code        Ordered By  1  US  MFM OB FOLLOW UP                   23183.98    VINA SOLIAN ----------------------------------------------------------------------  #  Order #                     Accession #                Episode #  1  [id number]                   [id number]                 [id number] ---------------------------------------------------------------------- Indications  Gestational hypertension without  significant   O13.3  proteinuria, third trimester (Labetalol )  Herpes simplex virus (HSV)                     O98.519 B00.9  Echogenic intracardiac focus of the heart      O35.8XX0  (EIF)  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)    Z14.8  LR NIPS - Female, Negative AFP, GTT WNL  [redacted] weeks gestation of pregnancy                Z3A.29 ---------------------------------------------------------------------- Fetal Evaluation  Num Of Fetuses:         1  Fetal Heart Rate(bpm):  145  Cardiac Activity:       Observed  Presentation:           Cephalic  Placenta:               Posterior  P. Cord Insertion:      Previously seen  Amniotic Fluid  AFI FV:      Within normal limits  AFI Sum(cm)     %Tile       Largest Pocket(cm)  12.23           31          3.89  RUQ(cm)       RLQ(cm)       LUQ(cm)        LLQ(cm)  3.23          3.61          1.5            3.89 ---------------------------------------------------------------------- Biometry  BPD:      74.8  mm     G. Age:  30w 0d         42  %    CI:        75.88   %    70 - 86                                                          FL/HC:      19.7   %    19.2 - 21.4  HC:      272.2  mm     G. Age:  29w 5d         14  %    HC/AC:      1.10        0.99 - 1.21  AC:      247.9  mm     G. Age:  29w 0d         21  %    FL/BPD:     71.7   %    71 - 87  FL:       53.6  mm     G. Age:  28w 3d          7  %    FL/AC:      21.6   %    20 - 24  HUM:        49  mm     G. Age:  28w 6d         29  %  LV:          2  mm  Est. FW:    1311  gm    2 lb 14 oz      13  % ---------------------------------------------------------------------- OB History  Gravidity:    2         Term:   0        Prem:   0        SAB:   0  TOP:          1       Ectopic:  0        Living: 0 ---------------------------------------------------------------------- Gestational Age  LMP:  35w 2d        Date:  03/10/23                 EDD:   12/15/23  U/S Today:     29w 2d                                        EDD:    01/26/24  Best:          29w 6d     Det. By:  U/S C R L  (05/29/23)    EDD:   01/22/24 ---------------------------------------------------------------------- Anatomy  Cranium:               Previously seen        Aortic Arch:            Previously seen  Cavum:                 Previously seen        Ductal Arch:            Previously seen  Ventricles:            Appears normal         Diaphragm:              Appears normal  Choroid Plexus:        Previously seen        Stomach:                Appears normal, left                                                                        sided  Cerebellum:            Previously seen        Abdomen:                Previously seen  Posterior Fossa:       Previously seen        Abdominal Wall:         Previously seen  Face:                  Orbits and profile     Cord Vessels:           Previously seen                         previously seen  Lips:                  Previously seen        Kidneys:                Appear normal  Thoracic:              Previously seen        Bladder:                Appears normal  Heart:                 Appears normal  Spine:                  Previously seen                         (4CH, axis, and                         situs)  RVOT:                  Previously seen        Upper Extremities:      Previously seen  LVOT:                  Previously seen        Lower Extremities:      Previously seen ---------------------------------------------------------------------- Cervix Uterus Adnexa  Cervix  Not visualized (advanced GA >24wks)  Uterus  No abnormality visualized.  Right Ovary  Size(cm)     2.47   x   2.35   x  1.64      Vol(ml): 4.98  Within normal limits.  Left Ovary  Within normal limits.  Cul De Sac  No free fluid seen.  Adnexa  No adnexal mass visualized ---------------------------------------------------------------------- Comments  Margaret Harvey is currently at 29 weeks and 6 days.  Margaret Harvey was  seen due to recently diagnosed  gestational hypertension that  is treated with labetalol  400 mg 3 times a day.  Her blood  pressure today was 153/89.  On today's exam, the overall EFW of 2 pounds 14 ounces  measures at the 13th percentile for her gestational age.  There was normal amniotic fluid noted with a total AFI of  12.23 cm.  The following were discussed during today's consultation:  Gestational hypertension  The patient was advised to continue taking labetalol  for  management of her blood pressures for the remainder of her  pregnancy.  The increased risk of preeclampsia and IUGR due to  gestational hypertension was discussed.  Margaret Harvey was advised to continue taking a daily baby aspirin  (81  mg) for preeclampsia prophylaxis.  Due to gestational hypertension, we will start weekly fetal  testing at 32 weeks.  We will reassess the fetal growth in 3 weeks.  The patient was advised that delivery for gestational  hypertension is usually recommended at around 37 weeks.  However, delivery prior to 37 weeks may be recommended  should her blood pressures be difficult to control or should  Margaret Harvey develop preeclampsia.  The patient will return in 2 weeks for a BPP.  Margaret Harvey stated that all of her questions were answered today.  A total of 20 minutes was spent counseling and coordinating  the care for this patient.  Greater than 50% of the time was  spent in direct face-to-face contact. ----------------------------------------------------------------------                   Steffan Keys, MD Electronically Signed Final Report   11/12/2023 12:39 pm ----------------------------------------------------------------------     Assessment and Plan: Patient Active Problem List   Diagnosis Date Noted   Preeclampsia 10/23/2023   Alpha thalassemia silent carrier 09/18/2023   Migraine headache without aura 05/29/2023   Supervision of other normal pregnancy, antepartum 05/29/2023   Generalized anxiety disorder 05/28/2022   Severe episode of recurrent major depressive  disorder, without psychotic features (HCC) 05/28/2022   Herpes simplex vulvovaginitis 04/03/2022   #Pre-clampsia without severe features #[redacted] weeks gestation of  pregnancy Patient with non-sustained severe range BP and otherwise high borderline mild range BP's despite adherence to labetalol  400 TID. In addition despite aggressive treatment of headache it has not yet resolved. Margaret Harvey is borderline for diagnosis of superimposed severe pre-eclampsia, plan to admit for obs to see if Margaret Harvey declares herself.  Admit to Antenatal Betamethasone  x 2 doses Routine antenatal care Discussed with Dr. Abigail who agrees with plan of care  Margaret CHRISTELLA Carolus, MD, MPH, FAAFP Attending Family Medicine Physician, [facility] for [facility], [facility]

## 2023-11-20 DIAGNOSIS — O139 Gestational [pregnancy-induced] hypertension without significant proteinuria, unspecified trimester: Secondary | ICD-10-CM | POA: Diagnosis present

## 2023-11-20 DIAGNOSIS — Z3A31 31 weeks gestation of pregnancy: Secondary | ICD-10-CM

## 2023-11-20 MED ORDER — VITAMIN B-6 25 MG PO TABS
50.0000 mg | ORAL_TABLET | Freq: Two times a day (BID) | ORAL | Status: DC
  Administered 2023-11-20 – 2023-11-25 (×7): 50 mg via ORAL
  Filled 2023-11-20 (×14): qty 2

## 2023-11-20 MED ORDER — METOCLOPRAMIDE HCL 10 MG PO TABS
5.0000 mg | ORAL_TABLET | Freq: Four times a day (QID) | ORAL | Status: DC | PRN
  Administered 2023-11-20: 5 mg via ORAL
  Filled 2023-11-20: qty 1

## 2023-11-20 MED ORDER — ESCITALOPRAM OXALATE 10 MG PO TABS
10.0000 mg | ORAL_TABLET | ORAL | Status: DC
  Administered 2023-11-20 – 2023-11-26 (×7): 10 mg via ORAL
  Filled 2023-11-20 (×7): qty 1

## 2023-11-20 MED ORDER — DOXYLAMINE SUCCINATE (SLEEP) 25 MG PO TABS
25.0000 mg | ORAL_TABLET | Freq: Two times a day (BID) | ORAL | Status: DC
  Administered 2023-11-20 – 2023-11-25 (×7): 25 mg via ORAL
  Filled 2023-11-20 (×13): qty 1

## 2023-11-20 MED ORDER — LORATADINE 10 MG PO TABS
10.0000 mg | ORAL_TABLET | Freq: Every day | ORAL | Status: DC
  Administered 2023-11-20 – 2023-11-25 (×6): 10 mg via ORAL
  Filled 2023-11-20 (×6): qty 1

## 2023-11-20 MED ORDER — ONDANSETRON HCL 4 MG/2ML IJ SOLN
4.0000 mg | Freq: Four times a day (QID) | INTRAMUSCULAR | Status: DC | PRN
  Administered 2023-11-20 – 2023-11-26 (×2): 4 mg via INTRAVENOUS
  Filled 2023-11-20 (×3): qty 2

## 2023-11-20 MED ORDER — ASPIRIN 81 MG PO CHEW
81.0000 mg | CHEWABLE_TABLET | Freq: Every day | ORAL | Status: DC
  Administered 2023-11-20 – 2023-11-26 (×7): 81 mg via ORAL
  Filled 2023-11-20 (×7): qty 1

## 2023-11-20 MED ORDER — AMLODIPINE BESYLATE 5 MG PO TABS
5.0000 mg | ORAL_TABLET | Freq: Every day | ORAL | Status: DC
  Administered 2023-11-20 – 2023-11-21 (×2): 5 mg via ORAL
  Filled 2023-11-20 (×2): qty 1

## 2023-11-20 MED ORDER — VALACYCLOVIR HCL 500 MG PO TABS
500.0000 mg | ORAL_TABLET | Freq: Two times a day (BID) | ORAL | Status: DC
  Administered 2023-11-20 – 2023-11-26 (×14): 500 mg via ORAL
  Filled 2023-11-20 (×14): qty 1

## 2023-11-20 MED ORDER — PROMETHAZINE HCL 25 MG PO TABS
25.0000 mg | ORAL_TABLET | Freq: Four times a day (QID) | ORAL | Status: DC | PRN
  Administered 2023-11-20 – 2023-11-26 (×7): 25 mg via ORAL
  Filled 2023-11-20 (×7): qty 1

## 2023-11-20 NOTE — Progress Notes (Signed)
 FACULTY PRACTICE ANTEPARTUM COMPREHENSIVE PROGRESS NOTE  Margaret Harvey is a 23 y.o. G2P0010 at [redacted]w[redacted]d who is admitted for GHTN vs Preeclampsia.  Estimated Date of Delivery: 01/22/24 Fetal presentation is unsure.  Length of Stay:  1 Days. Admitted 11/19/2023  Subjective: She has had some headaches which improved but then returned after procardia . This morning she is overall feeling well. Some nausea.  Patient reports good fetal movement.  She reports no uterine contractions, no bleeding and no loss of fluid per vagina.  Vitals:  Blood pressure (!) 142/81, pulse 99, temperature (!) 97.5 F (36.4 C), temperature source Oral, resp. rate (!) 22, height 5' 2 (1.575 m), weight 87.7 kg, last menstrual period 03/10/2023, SpO2 98%. Physical Examination: CONSTITUTIONAL: Well-developed, well-nourished female in no acute distress.  NEUROLOGIC: Alert and oriented to person, place, and time. No cranial nerve deficit noted. PSYCHIATRIC: Normal mood and affect. Normal behavior. Normal judgment and thought content. CARDIOVASCULAR: Normal heart rate noted, regular rhythm RESPIRATORY: Effort and breath sounds normal, no problems with respiration noted MUSCULOSKELETAL: Normal range of motion. No edema and no tenderness. 2+ distal pulses. ABDOMEN: Soft, nontender, nondistended, gravid. CERVIX:    Fetal monitoring: FHR: 150 bpm, Variability: moderate, Accelerations: Present 10x10 Decelerations: Absent  Uterine activity: Quiet  Results for orders placed or performed during the hospital encounter of 11/19/23 (from the past 48 hours)  Protein / creatinine ratio, urine     Status: None   Collection Time: 11/19/23  4:41 PM  Result Value Ref Range   Creatinine, Urine 21 mg/dL   Total Protein, Urine <6 mg/dL    Comment: NO NORMAL RANGE ESTABLISHED FOR THIS TEST   Protein Creatinine Ratio        0.00 - 0.15 mg/mg[Cre]    Comment: RESULT BELOW REPORTABLE RANGE, UNABLE TO CALCULATE. Performed at [facility], [address], [place], [place] [address]   Comprehensive metabolic panel     Status: Abnormal   Collection Time: 11/19/23  6:40 PM  Result Value Ref Range   Sodium 133 (L) 135 - 145 mmol/L   Potassium 3.6 3.5 - 5.1 mmol/L   Chloride 102 98 - 111 mmol/L   CO2 22 22 - 32 mmol/L   Glucose, Bld 94 70 - 99 mg/dL    Comment: Glucose reference range applies only to samples taken after fasting for at least 8 hours.   BUN 6 6 - 20 mg/dL   Creatinine, Ser 9.62 (L) 0.44 - 1.00 mg/dL   Calcium  8.9 8.9 - 10.3 mg/dL   Total Protein 6.6 6.5 - 8.1 g/dL   Albumin 2.9 (L) 3.5 - 5.0 g/dL   AST 19 15 - 41 U/L   ALT 13 0 - 44 U/L   Alkaline Phosphatase 149 (H) 38 - 126 U/L   Total Bilirubin 0.2 0.0 - 1.2 mg/dL   GFR, Estimated >39 >39 mL/min    Comment: (NOTE) Calculated using the CKD-EPI Creatinine Equation (2021)    Anion gap 9 5 - 15    Comment: Performed at [facility], [address] [place], [place] [address]  CBC     Status: Abnormal   Collection Time: 11/19/23  6:40 PM  Result Value Ref Range   WBC 11.8 (H) 4.0 - 10.5 K/uL   RBC 3.58 (L) 3.87 - 5.11 MIL/uL   Hemoglobin 9.9 (L) 12.0 - 15.0 g/dL   HCT 69.8 (L) 63.9 - 53.9 %   MCV 84.1 80.0 - 100.0 fL   MCH  27.7 26.0 - 34.0 pg   MCHC 32.9 30.0 - 36.0 g/dL   RDW 86.7 88.4 - 84.4 %   Platelets 320 150 - 400 K/uL   nRBC 0.0 0.0 - 0.2 %    Comment: Performed at [facility], [address], [place], [place] [address]  Type and screen [facility]     Status: None   Collection Time: 11/19/23  9:32 PM  Result Value Ref Range   ABO/RH(D) O POS    Antibody Screen NEG    Sample Expiration      11/22/2023,2359 Performed at [facility], [address], [place], [place] [address]     No results found.  Current scheduled medications  amLODipine   5 mg Oral Daily   aspirin   81 mg Oral Daily   betamethasone  acetate-betamethasone  sodium phosphate  12 mg Intramuscular Q24H   docusate sodium   100 mg Oral  Daily   labetalol   400 mg Oral TID   prenatal multivitamin  1 tablet Oral Q1200   valACYclovir   500 mg Oral BID    I have reviewed the patient's current medications.  ASSESSMENT: Active Problems:   Herpes simplex vulvovaginitis   Supervision of other normal pregnancy, antepartum   Preeclampsia   PLAN: Preeclampsia vs GHTN - Had P/C ratio that was elevated last week but most recent was normal. We will check a 24 hour urine since she will be in patient  - Stop Procardia  - gets headaches with it. Will switch to amlodipine  and will adjust Labetalol  and amlodipine  as needed - Admission labs normal. Will check Q72 hours and prn. Next routine check will be 7/10.  - Reviewed will do prolonged inpatient monitoring of labs and blood pressures. Would recommend inpatient for about a week and then potentially longer depending on her course and stability.   HSV - Start valtrex  prophylaxis  Anemia of pregnancy - MCW normal  - Check anemia labs on 7/10 with other blood work.   Fetal well being - NST daily - BPP at 32 weeks (ordered) - Growth on 6/30 was 13%ile, normal AC and AFI. Monitor Q3 weeks - BMZ 7/7-8    Vina Solian, MD, FACOG Obstetrician & Gynecologist, [facility] for [facility], [facility]

## 2023-11-20 NOTE — Plan of Care (Signed)
  Problem: Education: Goal: Knowledge of disease or condition will improve Outcome: Progressing Goal: Knowledge of the prescribed therapeutic regimen will improve Outcome: Progressing   Problem: Fluid Volume: Goal: Peripheral tissue perfusion will improve Outcome: Progressing   Problem: Clinical Measurements: Goal: Complications related to disease process, condition or treatment will be avoided or minimized Outcome: Progressing   Problem: Education: Goal: Knowledge of disease or condition will improve Outcome: Progressing Goal: Knowledge of the prescribed therapeutic regimen will improve Outcome: Progressing Goal: Individualized Educational Video(s) Outcome: Progressing   Problem: Clinical Measurements: Goal: Complications related to the disease process, condition or treatment will be avoided or minimized Outcome: Progressing   Problem: Education: Goal: Knowledge of General Education information will improve Description: Including pain rating scale, medication(s)/side effects and non-pharmacologic comfort measures Outcome: Progressing   Problem: Health Behavior/Discharge Planning: Goal: Ability to manage health-related needs will improve Outcome: Progressing   Problem: Clinical Measurements: Goal: Ability to maintain clinical measurements within normal limits will improve Outcome: Progressing Goal: Will remain free from infection Outcome: Progressing Goal: Diagnostic test results will improve Outcome: Progressing Goal: Respiratory complications will improve Outcome: Progressing Goal: Cardiovascular complication will be avoided Outcome: Progressing   Problem: Activity: Goal: Risk for activity intolerance will decrease Outcome: Progressing   Problem: Nutrition: Goal: Adequate nutrition will be maintained Outcome: Progressing   Problem: Coping: Goal: Level of anxiety will decrease Outcome: Progressing   Problem: Elimination: Goal: Will not experience  complications related to bowel motility Outcome: Progressing Goal: Will not experience complications related to urinary retention Outcome: Progressing   Problem: Pain Managment: Goal: General experience of comfort will improve and/or be controlled Outcome: Progressing   Problem: Safety: Goal: Ability to remain free from injury will improve Outcome: Progressing   Problem: Skin Integrity: Goal: Risk for impaired skin integrity will decrease Outcome: Progressing

## 2023-11-21 DIAGNOSIS — O133 Gestational [pregnancy-induced] hypertension without significant proteinuria, third trimester: Principal | ICD-10-CM

## 2023-11-21 LAB — PROTEIN, URINE, 24 HOUR
Collection Interval-UPROT: 24 h
Protein, Urine: <6 mg/dL

## 2023-11-21 MED ORDER — ALBUTEROL SULFATE (2.5 MG/3ML) 0.083% IN NEBU: 2.5000 mg | INHALATION_SOLUTION | Freq: Four times a day (QID) | RESPIRATORY_TRACT | Status: DC | PRN

## 2023-11-21 MED ORDER — SODIUM CHLORIDE 0.9% FLUSH
3.0000 mL | Freq: Two times a day (BID) | INTRAVENOUS | Status: DC
  Administered 2023-11-21 – 2023-11-26 (×10): 3 mL via INTRAVENOUS

## 2023-11-21 NOTE — Progress Notes (Signed)
 FACULTY PRACTICE ANTEPARTUM COMPREHENSIVE PROGRESS NOTE  Margaret Harvey is a 23 y.o. G2P0010 at [redacted]w[redacted]d who is admitted for GHTN vs Preeclampsia.  Estimated Date of Delivery: 01/22/24 Fetal presentation is unsure.  Length of Stay:  2 Days. Admitted 11/19/2023  Subjective: Doing well this morning. Had some painful cramping overnight, cervix was closed. Denies headache and feels well. Patient reports good fetal movement.  She reports no uterine contractions, no bleeding and no loss of fluid per vagina.  Vitals:  Blood pressure 138/80, pulse 97, temperature 98.9 F (37.2 C), temperature source Oral, resp. rate 17, height 5' 2 (1.575 m), weight 87.7 kg, last menstrual period 03/10/2023, SpO2 98%. Physical Examination: CONSTITUTIONAL: Well-developed, well-nourished female in no acute distress.  NEUROLOGIC: Alert and oriented to person, place, and time. No cranial nerve deficit noted. PSYCHIATRIC: Normal mood and affect. Normal behavior. Normal judgment and thought content. CARDIOVASCULAR: Normal heart rate noted, regular rhythm RESPIRATORY: Effort and breath sounds normal, no problems with respiration noted MUSCULOSKELETAL: Normal range of motion. No edema and no tenderness.  ABDOMEN: Soft, nontender, nondistended, gravid. CERVIX: Dilation: Closed Effacement (%): Thick Station: -2 Exam by:: Dr. Ilean  Fetal monitoring: FHR: 130s bpm, Variability: moderate, Accelerations: Present 10x10 Decelerations: Absent  Uterine activity: Quiet  Results for orders placed or performed during the hospital encounter of 11/19/23 (from the past 48 hours)  Protein / creatinine ratio, urine     Status: None   Collection Time: 11/19/23  4:41 PM  Result Value Ref Range   Creatinine, Urine 21 mg/dL   Total Protein, Urine <6 mg/dL    Comment: NO NORMAL RANGE ESTABLISHED FOR THIS TEST   Protein Creatinine Ratio        0.00 - 0.15 mg/mg[Cre]    Comment: RESULT BELOW REPORTABLE RANGE, UNABLE TO  CALCULATE. Performed at [facility], [address] [place], [place] [address]   Comprehensive metabolic panel     Status: Abnormal   Collection Time: 11/19/23  6:40 PM  Result Value Ref Range   Sodium 133 (L) 135 - 145 mmol/L   Potassium 3.6 3.5 - 5.1 mmol/L   Chloride 102 98 - 111 mmol/L   CO2 22 22 - 32 mmol/L   Glucose, Bld 94 70 - 99 mg/dL    Comment: Glucose reference range applies only to samples taken after fasting for at least 8 hours.   BUN 6 6 - 20 mg/dL   Creatinine, Ser 9.62 (L) 0.44 - 1.00 mg/dL   Calcium  8.9 8.9 - 10.3 mg/dL   Total Protein 6.6 6.5 - 8.1 g/dL   Albumin 2.9 (L) 3.5 - 5.0 g/dL   AST 19 15 - 41 U/L   ALT 13 0 - 44 U/L   Alkaline Phosphatase 149 (H) 38 - 126 U/L   Total Bilirubin 0.2 0.0 - 1.2 mg/dL   GFR, Estimated >39 >39 mL/min    Comment: (NOTE) Calculated using the CKD-EPI Creatinine Equation (2021)    Anion gap 9 5 - 15    Comment: Performed at [facility], [address], [place], [place] [address]  CBC     Status: Abnormal   Collection Time: 11/19/23  6:40 PM  Result Value Ref Range   WBC 11.8 (H) 4.0 - 10.5 K/uL   RBC 3.58 (L) 3.87 - 5.11 MIL/uL   Hemoglobin 9.9 (L) 12.0 - 15.0 g/dL   HCT 69.8 (L) 63.9 - 53.9 %   MCV 84.1 80.0 - 100.0 fL   MCH 27.7  26.0 - 34.0 pg   MCHC 32.9 30.0 - 36.0 g/dL   RDW 86.7 88.4 - 84.4 %   Platelets 320 150 - 400 K/uL   nRBC 0.0 0.0 - 0.2 %    Comment: Performed at [facility], [address] [place], [place] [address]  Type and screen [facility]     Status: None   Collection Time: 11/19/23  9:32 PM  Result Value Ref Range   ABO/RH(D) O POS    Antibody Screen NEG    Sample Expiration      11/22/2023,2359 Performed at [facility], [address] [place], [place] [address]     No results found.  Current scheduled medications  amLODipine   5 mg Oral Daily   aspirin   81 mg Oral Daily   docusate sodium   100 mg Oral Daily   doxylamine  (Sleep)  25 mg  Oral BID   escitalopram   10 mg Oral Q24H   labetalol   400 mg Oral TID   loratadine   10 mg Oral Daily   prenatal multivitamin  1 tablet Oral Q1200   pyridOXINE   50 mg Oral BID   valACYclovir   500 mg Oral BID    I have reviewed the patient's current medications.  ASSESSMENT: Principal Problem:   Gestational hypertension Active Problems:   Herpes simplex vulvovaginitis   Supervision of other normal pregnancy, antepartum   Preeclampsia   PLAN: Preeclampsia vs GHTN: on labetalol  400 mg TID with amlodipine  5 mg daily (added this admission-- procardia  causes headaches. Blood pressures currently mild, admission labs normal. Had P/C ratio that was elevated last week but most recent was normal.   -- 24 hour urine pending  -- Check Q72 hours and prn. Next routine check will be 7/10.  -- Prolonged inpatient monitoring of labs and blood pressures. Would recommend inpatient for about a week and then potentially longer depending on her course and stability.   HSV: valtrex  prophylaxis  Anemia of pregnancy - MCW normal  - Check anemia labs on 7/10 with other blood work.   Fetal well being - NST daily - BPP at 32 weeks (ordered) - Growth on 6/30 was 13%ile, normal AC and AFI. Monitor Q3 weeks - BMZ 7/7-8   Rollo ONEIDA Bring, MD, FACOG Obstetrician & Gynecologist, [facility] for [facility], [facility]

## 2023-11-22 ENCOUNTER — Encounter: Admitting: Obstetrics and Gynecology

## 2023-11-22 DIAGNOSIS — A6004 Herpesviral vulvovaginitis: Secondary | ICD-10-CM

## 2023-11-22 DIAGNOSIS — Z3A31 31 weeks gestation of pregnancy: Secondary | ICD-10-CM

## 2023-11-22 DIAGNOSIS — O1493 Unspecified pre-eclampsia, third trimester: Secondary | ICD-10-CM

## 2023-11-22 DIAGNOSIS — D563 Thalassemia minor: Secondary | ICD-10-CM

## 2023-11-22 DIAGNOSIS — Z3480 Encounter for supervision of other normal pregnancy, unspecified trimester: Secondary | ICD-10-CM

## 2023-11-22 LAB — TYPE AND SCREEN
ABO/RH(D): O POS
Antibody Screen: NEGATIVE

## 2023-11-22 LAB — COMPREHENSIVE METABOLIC PANEL WITH GFR
ALT: 13 U/L (ref 0–44)
AST: 18 U/L (ref 15–41)
Albumin: 2.8 g/dL — ABNORMAL LOW (ref 3.5–5.0)
Alkaline Phosphatase: 159 U/L — ABNORMAL HIGH (ref 38–126)
Anion gap: 11 (ref 5–15)
BUN: 5 mg/dL — ABNORMAL LOW (ref 6–20)
CO2: 22 mmol/L (ref 22–32)
Calcium: 8.8 mg/dL — ABNORMAL LOW (ref 8.9–10.3)
Chloride: 102 mmol/L (ref 98–111)
Creatinine, Ser: 0.48 mg/dL (ref 0.44–1.00)
GFR, Estimated: >60 mL/min (ref >60)
Glucose, Bld: 117 mg/dL — ABNORMAL HIGH (ref 70–99)
Potassium: 3.5 mmol/L (ref 3.5–5.1)
Sodium: 135 mmol/L (ref 135–145)
Total Bilirubin: 0.2 mg/dL (ref 0.0–1.2)
Total Protein: 6.8 g/dL (ref 6.5–8.1)

## 2023-11-22 LAB — IRON AND TIBC
Iron: 186 ug/dL — ABNORMAL HIGH (ref 28–170)
Saturation Ratios: 36 % — ABNORMAL HIGH (ref 10.4–31.8)
TIBC: 512 ug/dL — ABNORMAL HIGH (ref 250–450)
UIBC: 326 ug/dL

## 2023-11-22 LAB — CBC
HCT: 30.8 % — ABNORMAL LOW (ref 36.0–46.0)
Hemoglobin: 10.1 g/dL — ABNORMAL LOW (ref 12.0–15.0)
MCH: 27.3 pg (ref 26.0–34.0)
MCHC: 32.8 g/dL (ref 30.0–36.0)
MCV: 83.2 fL (ref 80.0–100.0)
Platelets: 336 K/uL (ref 150–400)
RBC: 3.7 MIL/uL — ABNORMAL LOW (ref 3.87–5.11)
RDW: 13.4 % (ref 11.5–15.5)
WBC: 16.5 K/uL — ABNORMAL HIGH (ref 4.0–10.5)
nRBC: 0 % (ref 0.0–0.2)

## 2023-11-22 LAB — RETICULOCYTES
Immature Retic Fract: 17.2 % — ABNORMAL HIGH (ref 2.3–15.9)
RBC.: 3.68 MIL/uL — ABNORMAL LOW (ref 3.87–5.11)
Retic Count, Absolute: 63.3 K/uL (ref 19.0–186.0)
Retic Ct Pct: 1.7 % (ref 0.4–3.1)

## 2023-11-22 LAB — FERRITIN: Ferritin: 11 ng/mL (ref 11–307)

## 2023-11-22 LAB — VITAMIN B12: Vitamin B-12: 205 pg/mL (ref 180–914)

## 2023-11-22 LAB — FOLATE: Folate: 16.9 ng/mL (ref >5.9)

## 2023-11-22 MED ORDER — OXYCODONE HCL 5 MG PO TABS
5.0000 mg | ORAL_TABLET | ORAL | Status: DC | PRN
  Administered 2023-11-22 – 2023-11-25 (×5): 5 mg via ORAL
  Filled 2023-11-22 (×5): qty 1

## 2023-11-22 MED ORDER — ENOXAPARIN SODIUM 40 MG/0.4ML IJ SOSY
40.0000 mg | PREFILLED_SYRINGE | Freq: Every day | INTRAMUSCULAR | Status: DC
  Administered 2023-11-22 – 2023-11-26 (×5): 40 mg via SUBCUTANEOUS
  Filled 2023-11-22 (×5): qty 0.4

## 2023-11-22 MED ORDER — LABETALOL HCL 5 MG/ML IV SOLN
40.0000 mg | INTRAVENOUS | Status: DC | PRN
Start: 2023-11-22 — End: 2023-11-27

## 2023-11-22 MED ORDER — LABETALOL HCL 5 MG/ML IV SOLN: 20.0000 mg | INTRAVENOUS | Status: DC | PRN

## 2023-11-22 MED ORDER — AMLODIPINE BESYLATE 5 MG PO TABS
10.0000 mg | ORAL_TABLET | Freq: Every day | ORAL | Status: DC
  Administered 2023-11-22 – 2023-11-26 (×5): 10 mg via ORAL
  Filled 2023-11-22 (×5): qty 2

## 2023-11-22 MED ORDER — FERROUS SULFATE 325 (65 FE) MG PO TABS
325.0000 mg | ORAL_TABLET | ORAL | Status: DC
  Administered 2023-11-22 – 2023-11-26 (×3): 325 mg via ORAL
  Filled 2023-11-22 (×3): qty 1

## 2023-11-22 MED ORDER — HYDRALAZINE HCL 20 MG/ML IJ SOLN
10.0000 mg | INTRAMUSCULAR | Status: DC | PRN
Start: 2023-11-22 — End: 2023-11-27

## 2023-11-22 MED ORDER — HYDRALAZINE HCL 20 MG/ML IJ SOLN
5.0000 mg | INTRAMUSCULAR | Status: DC | PRN
Start: 2023-11-22 — End: 2023-11-27

## 2023-11-22 NOTE — Plan of Care (Signed)
  Problem: Education: Goal: Knowledge of disease or condition will improve Outcome: Progressing Goal: Knowledge of the prescribed therapeutic regimen will improve Outcome: Progressing   Problem: Fluid Volume: Goal: Peripheral tissue perfusion will improve Outcome: Progressing   Problem: Clinical Measurements: Goal: Complications related to disease process, condition or treatment will be avoided or minimized Outcome: Progressing   Problem: Education: Goal: Knowledge of disease or condition will improve Outcome: Progressing Goal: Knowledge of the prescribed therapeutic regimen will improve Outcome: Progressing Goal: Individualized Educational Video(s) Outcome: Progressing   Problem: Clinical Measurements: Goal: Complications related to the disease process, condition or treatment will be avoided or minimized Outcome: Progressing   Problem: Education: Goal: Knowledge of General Education information will improve Description: Including pain rating scale, medication(s)/side effects and non-pharmacologic comfort measures Outcome: Progressing   Problem: Health Behavior/Discharge Planning: Goal: Ability to manage health-related needs will improve Outcome: Progressing   Problem: Clinical Measurements: Goal: Ability to maintain clinical measurements within normal limits will improve Outcome: Progressing Goal: Will remain free from infection Outcome: Progressing Goal: Diagnostic test results will improve Outcome: Progressing Goal: Respiratory complications will improve Outcome: Progressing Goal: Cardiovascular complication will be avoided Outcome: Progressing   Problem: Activity: Goal: Risk for activity intolerance will decrease Outcome: Progressing   Problem: Nutrition: Goal: Adequate nutrition will be maintained Outcome: Progressing   Problem: Coping: Goal: Level of anxiety will decrease Outcome: Progressing   Problem: Elimination: Goal: Will not experience  complications related to bowel motility Outcome: Progressing Goal: Will not experience complications related to urinary retention Outcome: Progressing   Problem: Pain Managment: Goal: General experience of comfort will improve and/or be controlled Outcome: Progressing   Problem: Safety: Goal: Ability to remain free from injury will improve Outcome: Progressing   Problem: Skin Integrity: Goal: Risk for impaired skin integrity will decrease Outcome: Progressing

## 2023-11-22 NOTE — Progress Notes (Signed)
 FACULTY PRACTICE ANTEPARTUM COMPREHENSIVE PROGRESS NOTE  Margaret Harvey is a 23 y.o. G2P0010 at [redacted]w[redacted]d who is admitted for GHTN vs Preeclampsia.  Estimated Date of Delivery: 01/22/24 Fetal presentation is unsure.  Length of Stay:  3 Days. Admitted 11/19/2023  Subjective: Doing well this morning. Reports the beginning of a mild headache, but will take medication soon.  Patient denies any visual symptoms, RUQ/epigastric pain or other concerning symptoms. Patient reports good fetal movement.  She reports no uterine contractions, no bleeding and no loss of fluid per vagina.  Vitals:  Blood pressure (!) 145/87, pulse 87, temperature 98.2 F (36.8 C), temperature source Oral, resp. rate 18, height 5' 2 (1.575 m), weight 87.7 kg, last menstrual period 03/10/2023, SpO2 99%. Patient Vitals for the past 24 hrs:  BP Temp Temp src Pulse Resp SpO2  11/22/23 0748 (!) 145/87 98.2 F (36.8 C) Oral 87 18 99 %  11/22/23 0353 (!) 152/85 98.5 F (36.9 C) Oral 98 17 98 %  11/21/23 2343 136/86 98.2 F (36.8 C) Oral 93 18 100 %  11/21/23 2125 (!) 157/90 -- -- (!) 102 -- --  11/21/23 2012 (!) 149/82 98.6 F (37 C) Oral (!) 105 18 99 %  11/21/23 1737 (!) 149/83 -- -- (!) 111 19 100 %  11/21/23 1603 (!) 145/89 98.8 F (37.1 C) Oral (!) 103 18 99 %    Physical Examination: CONSTITUTIONAL: Well-developed, well-nourished female in no acute distress.  NEUROLOGIC: Alert and oriented to person, place, and time. No cranial nerve deficit noted. PSYCHIATRIC: Normal mood and affect. Normal behavior. Normal judgment and thought content. CARDIOVASCULAR: Normal heart rate noted, regular rhythm RESPIRATORY: Effort and breath sounds normal, no problems with respiration noted MUSCULOSKELETAL: Normal range of motion. No edema and no tenderness.  ABDOMEN: Soft, nontender, nondistended, gravid. CERVIX: Dilation: Closed Effacement (%): Thick Station: -2 Exam by:: Dr. Ilean  Fetal monitoring: FHR: 130s bpm, Variability:  moderate, Accelerations: Present 10x10 Decelerations: Absent  Uterine activity: Quiet  Results for orders placed or performed during the hospital encounter of 11/19/23 (from the past 48 hours)  Protein, urine, 24 hour     Status: None   Collection Time: 11/20/23 10:40 AM  Result Value Ref Range   Urine Total Volume-UPROT mL   Collection Interval-UPROT 24 hours   Protein, Urine <6 mg/dL   Protein, 75Y Urine        50 - 100 mg/day    Comment: RESULT BELOW REPORTABLE RANGE, UNABLE TO CALCULATE. Performed at [facility], [address] [place], [place] [address]   Type and screen [facility]     Status: None   Collection Time: 11/22/23  5:26 AM  Result Value Ref Range   ABO/RH(D) O POS    Antibody Screen NEG    Sample Expiration      11/25/2023,2359 Performed at [facility], [address] [place], [place] [address]   CBC     Status: Abnormal   Collection Time: 11/22/23  5:31 AM  Result Value Ref Range   WBC 16.5 (H) 4.0 - 10.5 K/uL   RBC 3.70 (L) 3.87 - 5.11 MIL/uL   Hemoglobin 10.1 (L) 12.0 - 15.0 g/dL   HCT 69.1 (L) 63.9 - 53.9 %   MCV 83.2 80.0 - 100.0 fL   MCH 27.3 26.0 - 34.0 pg   MCHC 32.8 30.0 - 36.0 g/dL   RDW 86.5 88.4 - 84.4 %   Platelets 336 150 - 400 K/uL  nRBC 0.0 0.0 - 0.2 %    Comment: Performed at [facility], [address] [place], [place] [address]  Comprehensive metabolic panel     Status: Abnormal   Collection Time: 11/22/23  5:31 AM  Result Value Ref Range   Sodium 135 135 - 145 mmol/L   Potassium 3.5 3.5 - 5.1 mmol/L   Chloride 102 98 - 111 mmol/L   CO2 22 22 - 32 mmol/L   Glucose, Bld 117 (H) 70 - 99 mg/dL    Comment: Glucose reference range applies only to samples taken after fasting for at least 8 hours.   BUN 5 (L) 6 - 20 mg/dL   Creatinine, Ser 9.51 0.44 - 1.00 mg/dL   Calcium  8.8 (L) 8.9 - 10.3 mg/dL   Total Protein 6.8 6.5 - 8.1 g/dL   Albumin 2.8 (L) 3.5 - 5.0 g/dL   AST 18 15 - 41 U/L    ALT 13 0 - 44 U/L   Alkaline Phosphatase 159 (H) 38 - 126 U/L   Total Bilirubin 0.2 0.0 - 1.2 mg/dL   GFR, Estimated >39 >39 mL/min    Comment: (NOTE) Calculated using the CKD-EPI Creatinine Equation (2021)    Anion gap 11 5 - 15    Comment: Performed at [facility], [address], [place], [place] [address]  Vitamin B12     Status: None   Collection Time: 11/22/23  5:31 AM  Result Value Ref Range   Vitamin B-12 205 180 - 914 pg/mL    Comment: (NOTE) This assay is not validated for testing neonatal or myeloproliferative syndrome specimens for Vitamin B12 levels. Performed at [facility], [address] [place], [place] [address]   Folate     Status: None   Collection Time: 11/22/23  5:31 AM  Result Value Ref Range   Folate 16.9 >5.9 ng/mL    Comment: Performed at [facility], [address], [place], [place] [address]  Iron and TIBC     Status: Abnormal   Collection Time: 11/22/23  5:31 AM  Result Value Ref Range   Iron 186 (H) 28 - 170 ug/dL   TIBC 487 (H) 749 - 549 ug/dL   Saturation Ratios 36 (H) 10.4 - 31.8 %   UIBC 326 ug/dL    Comment: Performed at [facility], [address], [place], [place] [address]  Ferritin     Status: None   Collection Time: 11/22/23  5:31 AM  Result Value Ref Range   Ferritin 11 11 - 307 ng/mL    Comment: Performed at [facility], [address] [place], [place] [address]  Reticulocytes     Status: Abnormal   Collection Time: 11/22/23  5:31 AM  Result Value Ref Range   Retic Ct Pct 1.7 0.4 - 3.1 %   RBC. 3.68 (L) 3.87 - 5.11 MIL/uL   Retic Count, Absolute 63.3 19.0 - 186.0 K/uL   Immature Retic Fract 17.2 (H) 2.3 - 15.9 %    Comment: Performed at [facility], [address] [place], [place] [address]    No results found.  Current scheduled medications  amLODipine   10 mg Oral Daily   aspirin   81 mg Oral Daily   docusate sodium   100 mg Oral Daily   doxylamine  (Sleep)  25 mg Oral BID    escitalopram   10 mg Oral Q24H   labetalol   400 mg Oral TID  loratadine   10 mg Oral Daily   prenatal multivitamin  1 tablet Oral Q1200   pyridOXINE   50 mg Oral BID   sodium chloride  flush  3 mL Intravenous Q12H   valACYclovir   500 mg Oral BID    I have reviewed the patient's current medications.  ASSESSMENT: Principal Problem:   Gestational hypertension Active Problems:   Herpes simplex vulvovaginitis   Supervision of other normal pregnancy, antepartum   Preeclampsia   PLAN: Preeclampsia vs GHTN: Increased Amlodipine  today to 10 mg daily, continue Labetalol  400 mg TID.SABRA Had P/C ratio that was elevated last week but most recent 24 hr protein level was below reportable range. Labs today are stable. - Check labs Q72 hours and prn. Next routine check will be 7/13.  - Prolonged inpatient monitoring of labs and blood pressures. Would recommend inpatient for about a week and then potentially longer depending on her course and stability.   HSV: On valtrex  prophylaxis  Anemia of pregnancy - MCW normal  - Stable hemoglobin 10.1, ferritin 11, on oral iron therapy  Fetal well being - NST BID - BPP at 32 weeks (ordered) - Growth on 6/30 was 13%ile, normal AC and AFI. Monitor Q3 weeks - s/p BMZ 7/7-8  5. Continue routine antenatal care.   Gloris Hugger, MD  Obstetrician & Gynecologist, [facility] for [employer], [facility]

## 2023-11-23 MED ORDER — LABETALOL HCL 200 MG PO TABS
600.0000 mg | ORAL_TABLET | Freq: Three times a day (TID) | ORAL | Status: DC
  Administered 2023-11-23 – 2023-11-26 (×12): 600 mg via ORAL
  Filled 2023-11-23 (×12): qty 3

## 2023-11-23 NOTE — Progress Notes (Signed)
 FACULTY PRACTICE ANTEPARTUM COMPREHENSIVE PROGRESS NOTE  Bruna Dills is a 23 y.o. G2P0010 at [redacted]w[redacted]d who is admitted for GHTN vs Preeclampsia.  Estimated Date of Delivery: 01/22/24 Fetal presentation is unsure.  Length of Stay:  4 Days. Admitted 11/19/2023  Subjective: Doing well this morning. Reports the beginning of a mild headache, but will take medication soon.  Patient denies any visual symptoms, RUQ/epigastric pain or other concerning symptoms. Patient reports good fetal movement.  She reports no uterine contractions, no bleeding and no loss of fluid per vagina.  Vitals:  Blood pressure (!) 147/88, pulse 88, temperature 98 F (36.7 C), temperature source Oral, resp. rate 18, height 5' 2 (1.575 m), weight 87.7 kg, last menstrual period 03/10/2023, SpO2 99%. Patient Vitals for the past 24 hrs:  BP Temp Temp src Pulse Resp SpO2  11/23/23 0739 (!) 147/88 98 F (36.7 C) Oral 88 18 99 %  11/23/23 0459 132/76 98.1 F (36.7 C) Oral 87 20 100 %  11/23/23 0234 (!) 142/81 98.3 F (36.8 C) Oral 100 18 99 %  11/22/23 1951 (!) 144/83 98 F (36.7 C) Oral (!) 103 16 100 %  11/22/23 1549 (!) 157/93 98 F (36.7 C) Oral 100 18 100 %  11/22/23 1239 (!) 142/87 97.9 F (36.6 C) Oral (!) 107 20 99 %    Physical Examination: CONSTITUTIONAL: Well-developed, well-nourished female in no acute distress.  NEUROLOGIC: Alert and oriented to person, place, and time. No cranial nerve deficit noted. PSYCHIATRIC: Normal mood and affect. Normal behavior. Normal judgment and thought content. CARDIOVASCULAR: Normal heart rate noted, regular rhythm RESPIRATORY: Effort and breath sounds normal, no problems with respiration noted MUSCULOSKELETAL: Normal range of motion. No edema and no tenderness.  ABDOMEN: Soft, nontender, nondistended, gravid. CERVIX: Dilation: Closed Effacement (%): Thick Station: -2 Exam by:: Dr. Ilean  Fetal monitoring: FHR: 140s bpm, Variability: moderate, Accelerations: Present 10x10  Decelerations: Absent  Uterine activity: Quiet   Pertinent results:    Latest Ref Rng & Units 11/22/2023    5:31 AM 11/19/2023    6:40 PM 11/14/2023   11:08 AM  CBC  WBC 4.0 - 10.5 K/uL 16.5  11.8  11.7   Hemoglobin 12.0 - 15.0 g/dL 89.8  9.9  89.2   Hematocrit 36.0 - 46.0 % 30.8  30.1  33.1   Platelets 150 - 400 K/uL 336  320  322       Latest Ref Rng & Units 11/22/2023    5:31 AM 11/19/2023    6:40 PM 11/14/2023   11:08 AM  CMP  Glucose 70 - 99 mg/dL 882  94  89   BUN 6 - 20 mg/dL 5  6  5    Creatinine 0.44 - 1.00 mg/dL 9.51  9.62  9.52   Sodium 135 - 145 mmol/L 135  133  136   Potassium 3.5 - 5.1 mmol/L 3.5  3.6  3.8   Chloride 98 - 111 mmol/L 102  102  100   CO2 22 - 32 mmol/L 22  22  20    Calcium  8.9 - 10.3 mg/dL 8.8  8.9  9.1   Total Protein 6.5 - 8.1 g/dL 6.8  6.6  6.9   Total Bilirubin 0.0 - 1.2 mg/dL 0.2  0.2  <9.7   Alkaline Phos 38 - 126 U/L 159  149  203   AST 15 - 41 U/L 18  19  16    ALT 0 - 44 U/L 13  13  16     No  results found.  Current scheduled medications  amLODipine   10 mg Oral Daily   aspirin   81 mg Oral Daily   docusate sodium   100 mg Oral Daily   doxylamine  (Sleep)  25 mg Oral BID   enoxaparin  (LOVENOX ) injection  40 mg Subcutaneous Daily   escitalopram   10 mg Oral Q24H   ferrous sulfate   325 mg Oral QODAY   labetalol   600 mg Oral TID   loratadine   10 mg Oral Daily   prenatal multivitamin  1 tablet Oral Q1200   pyridOXINE   50 mg Oral BID   sodium chloride  flush  3 mL Intravenous Q12H   valACYclovir   500 mg Oral BID    I have reviewed the patient's current medications.  ASSESSMENT: Principal Problem:   Gestational hypertension Active Problems:   Herpes simplex vulvovaginitis   Supervision of other normal pregnancy, antepartum   Preeclampsia   [redacted] weeks gestation of pregnancy   PLAN: Preeclampsia vs GHTN: Increased Amlodipine  today to 10 mg daily yesterday but BPs still elevated.  Will also increase Labetalol  to 600 mg TID for hopefully  improved BP control. Had P/C ratio that was elevated last week but most recent 24 hr protein level was below reportable range. Labs today are stable. - Check labs Q72 hours and prn. Next routine check will be 7/13.  - Prolonged inpatient monitoring of labs and blood pressures. Would recommend inpatient for about a week and then potentially longer depending on her course and stability.   HSV: On valtrex  prophylaxis  Anemia of pregnancy - MCW normal  - Stable hemoglobin 10.1, ferritin 11, on oral iron therapy  Fetal well being - NST BID - BPP at 32 weeks (ordered) - Growth on 6/30 was 13%ile, normal AC and AFI. Monitor Q3 weeks - s/p BMZ 7/7-8  5. Continue routine antenatal care.   Gloris Hugger, MD Obstetrician & Gynecologist, [facility] for [employer], [facility]

## 2023-11-24 ENCOUNTER — Encounter (HOSPITAL_COMMUNITY): Payer: Self-pay | Admitting: Obstetrics and Gynecology

## 2023-11-24 NOTE — Progress Notes (Signed)
 FACULTY PRACTICE ANTEPARTUM PROGRESS NOTE  Megean Fabio is a 23 y.o. G2P0010 at [redacted]w[redacted]d who is admitted for [facility] versus mild preeclampsia.  Estimated Date of Delivery: 01/22/24 Fetal presentation is unsure.  Length of Stay:  5 Days. Admitted 11/19/2023  Subjective: Pt seen.  She was resting quietly.  Pt notes  the start of a headache but no visual changes or RUQ pain. Patient reports normal fetal movement.  She denies uterine contractions, denies bleeding and leaking of fluid per vagina.  Vitals:  Blood pressure (!) 145/85, pulse 88, temperature 98 F (36.7 C), temperature source Oral, resp. rate 18, height 5' 2 (1.575 m), weight 87.7 kg, last menstrual period 03/10/2023, SpO2 100%. Physical Examination: CONSTITUTIONAL: Well-developed, well-nourished female in no acute distress.  HENT:  Normocephalic, atraumatic, External right and left ear normal. Oropharynx is clear and moist EYES: Conjunctivae and EOM are normal.  NECK: Normal range of motion, supple, no masses. SKIN: Skin is warm and dry. No rash noted. Not diaphoretic. No erythema. No pallor. NEUROLGIC: Alert and oriented to person, place, and time. Normal reflexes, muscle tone coordination. No cranial nerve deficit noted. PSYCHIATRIC: Normal mood and affect. Normal behavior. Normal judgment and thought content. CARDIOVASCULAR: Normal heart rate noted, regular rhythm RESPIRATORY: Effort and breath sounds normal, no problems with respiration noted MUSCULOSKELETAL: Normal range of motion. No edema and no tenderness. ABDOMEN: Soft, nontender, nondistended, gravid. CERVIX: deferred  Fetal monitoring: FHR: 150s bpm, Variability: moderate, Accelerations: Present, Decelerations: Absent , 10 x 10 accels, category 1 strip but not reactive Uterine activity: none  No results found for this or any previous visit (from the past 48 hours).  I have reviewed the patient's current medications.  ASSESSMENT: Principal Problem:   Preeclampsia,  third trimester Active Problems:   Herpes simplex vulvovaginitis   Supervision of high-risk pregnancy   [redacted] weeks gestation of pregnancy   PLAN: Preeclampsia vs GHTN: blood pressure stable on current regimen.  Current bp elevated but in mild range, no change in meds currently HSV: continue prophylaxis Fetal well being: Current NST was reassuring, continue BID testing, BPP at 32 weeks Continue routine antenatal care   Continue routine antenatal care.   Jerilynn Buddle, MD [facility] Faculty Attending, Center for [facility] 11/24/2023 7:02 AM

## 2023-11-24 NOTE — Plan of Care (Signed)
  Problem: Education: Goal: Knowledge of disease or condition will improve Outcome: Progressing Goal: Knowledge of the prescribed therapeutic regimen will improve Outcome: Progressing   Problem: Fluid Volume: Goal: Peripheral tissue perfusion will improve Outcome: Progressing   Problem: Clinical Measurements: Goal: Complications related to disease process, condition or treatment will be avoided or minimized Outcome: Progressing   Problem: Education: Goal: Knowledge of disease or condition will improve Outcome: Progressing Goal: Knowledge of the prescribed therapeutic regimen will improve Outcome: Progressing Goal: Individualized Educational Video(s) Outcome: Progressing   Problem: Clinical Measurements: Goal: Complications related to the disease process, condition or treatment will be avoided or minimized Outcome: Progressing   Problem: Education: Goal: Knowledge of General Education information will improve Description: Including pain rating scale, medication(s)/side effects and non-pharmacologic comfort measures Outcome: Progressing   Problem: Health Behavior/Discharge Planning: Goal: Ability to manage health-related needs will improve Outcome: Progressing   Problem: Clinical Measurements: Goal: Ability to maintain clinical measurements within normal limits will improve Outcome: Progressing Goal: Will remain free from infection Outcome: Progressing Goal: Diagnostic test results will improve Outcome: Progressing Goal: Respiratory complications will improve Outcome: Progressing Goal: Cardiovascular complication will be avoided Outcome: Progressing   Problem: Activity: Goal: Risk for activity intolerance will decrease Outcome: Progressing   Problem: Nutrition: Goal: Adequate nutrition will be maintained Outcome: Progressing   Problem: Coping: Goal: Level of anxiety will decrease Outcome: Progressing   Problem: Elimination: Goal: Will not experience  complications related to bowel motility Outcome: Progressing Goal: Will not experience complications related to urinary retention Outcome: Progressing   Problem: Pain Managment: Goal: General experience of comfort will improve and/or be controlled Outcome: Progressing   Problem: Safety: Goal: Ability to remain free from injury will improve Outcome: Progressing   Problem: Skin Integrity: Goal: Risk for impaired skin integrity will decrease Outcome: Progressing

## 2023-11-25 ENCOUNTER — Encounter (HOSPITAL_COMMUNITY): Payer: Self-pay | Admitting: Obstetrics and Gynecology

## 2023-11-25 LAB — COMPREHENSIVE METABOLIC PANEL WITH GFR
ALT: 14 U/L (ref 0–44)
AST: 25 U/L (ref 15–41)
Albumin: 3 g/dL — ABNORMAL LOW (ref 3.5–5.0)
Alkaline Phosphatase: 205 U/L — ABNORMAL HIGH (ref 38–126)
Anion gap: 8 (ref 5–15)
BUN: 6 mg/dL (ref 6–20)
CO2: 24 mmol/L (ref 22–32)
Calcium: 9.1 mg/dL (ref 8.9–10.3)
Chloride: 102 mmol/L (ref 98–111)
Creatinine, Ser: 0.45 mg/dL (ref 0.44–1.00)
GFR, Estimated: >60 mL/min (ref >60)
Glucose, Bld: 120 mg/dL — ABNORMAL HIGH (ref 70–99)
Potassium: 3.7 mmol/L (ref 3.5–5.1)
Sodium: 134 mmol/L — ABNORMAL LOW (ref 135–145)
Total Bilirubin: 0.6 mg/dL (ref 0.0–1.2)
Total Protein: 6.6 g/dL (ref 6.5–8.1)

## 2023-11-25 LAB — CBC
HCT: 31.9 % — ABNORMAL LOW (ref 36.0–46.0)
Hemoglobin: 10.3 g/dL — ABNORMAL LOW (ref 12.0–15.0)
MCH: 27.2 pg (ref 26.0–34.0)
MCHC: 32.3 g/dL (ref 30.0–36.0)
MCV: 84.2 fL (ref 80.0–100.0)
Platelets: 341 K/uL (ref 150–400)
RBC: 3.79 MIL/uL — ABNORMAL LOW (ref 3.87–5.11)
RDW: 13.4 % (ref 11.5–15.5)
WBC: 13.1 K/uL — ABNORMAL HIGH (ref 4.0–10.5)
nRBC: 0 % (ref 0.0–0.2)

## 2023-11-25 LAB — TYPE AND SCREEN
ABO/RH(D): O POS
Antibody Screen: NEGATIVE

## 2023-11-25 NOTE — Plan of Care (Signed)
  Problem: Education: Goal: Knowledge of disease or condition will improve Outcome: Progressing Goal: Knowledge of the prescribed therapeutic regimen will improve Outcome: Progressing   Problem: Fluid Volume: Goal: Peripheral tissue perfusion will improve Outcome: Progressing   Problem: Clinical Measurements: Goal: Complications related to disease process, condition or treatment will be avoided or minimized Outcome: Progressing   Problem: Education: Goal: Knowledge of disease or condition will improve Outcome: Progressing Goal: Knowledge of the prescribed therapeutic regimen will improve Outcome: Progressing Goal: Individualized Educational Video(s) Outcome: Progressing   Problem: Clinical Measurements: Goal: Complications related to the disease process, condition or treatment will be avoided or minimized Outcome: Progressing   Problem: Education: Goal: Knowledge of General Education information will improve Description: Including pain rating scale, medication(s)/side effects and non-pharmacologic comfort measures Outcome: Progressing   Problem: Health Behavior/Discharge Planning: Goal: Ability to manage health-related needs will improve Outcome: Progressing   Problem: Clinical Measurements: Goal: Ability to maintain clinical measurements within normal limits will improve Outcome: Progressing Goal: Will remain free from infection Outcome: Progressing Goal: Diagnostic test results will improve Outcome: Progressing Goal: Respiratory complications will improve Outcome: Progressing Goal: Cardiovascular complication will be avoided Outcome: Progressing   Problem: Activity: Goal: Risk for activity intolerance will decrease Outcome: Progressing   Problem: Nutrition: Goal: Adequate nutrition will be maintained Outcome: Progressing   Problem: Coping: Goal: Level of anxiety will decrease Outcome: Progressing   Problem: Elimination: Goal: Will not experience  complications related to bowel motility Outcome: Progressing Goal: Will not experience complications related to urinary retention Outcome: Progressing   Problem: Pain Managment: Goal: General experience of comfort will improve and/or be controlled Outcome: Progressing   Problem: Safety: Goal: Ability to remain free from injury will improve Outcome: Progressing   Problem: Skin Integrity: Goal: Risk for impaired skin integrity will decrease Outcome: Progressing

## 2023-11-25 NOTE — Progress Notes (Signed)
 FACULTY PRACTICE ANTEPARTUM PROGRESS NOTE  Margaret Harvey is a 23 y.o. G2P0010 at [redacted]w[redacted]d who is admitted for [facility] versus mild preeclampsia.  Estimated Date of Delivery: 01/22/24 Fetal presentation is unsure.  Length of Stay:  6 Days. Admitted 11/19/2023  Subjective: Pt resting quietly.  She denies headache, visual changes and RUQ pain.  Pt does note some mild nausea this AM. Patient reports normal fetal movement.  She denies uterine contractions, denies bleeding and leaking of fluid per vagina.  Vitals:  Blood pressure (!) 146/85, pulse 94, temperature 97.7 F (36.5 C), temperature source Oral, resp. rate 18, height 5' 2 (1.575 m), weight 87.7 kg, last menstrual period 03/10/2023, SpO2 99%. Physical Examination: CONSTITUTIONAL: Well-developed, well-nourished female in no acute distress.  HENT:  Normocephalic, atraumatic, External right and left ear normal. Oropharynx is clear and moist EYES: Conjunctivae and EOM are normal.  NECK: Normal range of motion, supple, no masses. SKIN: Skin is warm and dry. No rash noted. Not diaphoretic. No erythema. No pallor. NEUROLGIC: Alert and oriented to person, place, and time. Normal reflexes, muscle tone coordination. No cranial nerve deficit noted. PSYCHIATRIC: Normal mood and affect. Normal behavior. Normal judgment and thought content. CARDIOVASCULAR: Normal heart rate noted, regular rhythm RESPIRATORY: Effort and breath sounds normal, no problems with respiration noted MUSCULOSKELETAL: Normal range of motion. No edema and no tenderness. ABDOMEN: Soft, nontender, nondistended, gravid. CERVIX: deferred  Fetal monitoring: FHR: 140s bpm, Variability: moderate, Accelerations: Present, Decelerations: occasional variable Nonreactive but reassuring  Uterine activity: none  Results for orders placed or performed during the hospital encounter of 11/19/23 (from the past 48 hours)  Type and screen [facility]     Status: None   Collection Time:  11/25/23  4:22 AM  Result Value Ref Range   ABO/RH(D) O POS    Antibody Screen NEG    Sample Expiration      11/28/2023,2359 Performed at [facility], [address] [place], [place] [address]   CBC     Status: Abnormal   Collection Time: 11/25/23  4:27 AM  Result Value Ref Range   WBC 13.1 (H) 4.0 - 10.5 K/uL   RBC 3.79 (L) 3.87 - 5.11 MIL/uL   Hemoglobin 10.3 (L) 12.0 - 15.0 g/dL   HCT 68.0 (L) 63.9 - 53.9 %   MCV 84.2 80.0 - 100.0 fL   MCH 27.2 26.0 - 34.0 pg   MCHC 32.3 30.0 - 36.0 g/dL   RDW 86.5 88.4 - 84.4 %   Platelets 341 150 - 400 K/uL   nRBC 0.0 0.0 - 0.2 %    Comment: Performed at [facility], [address] [place], [place] [address]  Comprehensive metabolic panel     Status: Abnormal   Collection Time: 11/25/23  4:27 AM  Result Value Ref Range   Sodium 134 (L) 135 - 145 mmol/L   Potassium 3.7 3.5 - 5.1 mmol/L   Chloride 102 98 - 111 mmol/L   CO2 24 22 - 32 mmol/L   Glucose, Bld 120 (H) 70 - 99 mg/dL    Comment: Glucose reference range applies only to samples taken after fasting for at least 8 hours.   BUN 6 6 - 20 mg/dL   Creatinine, Ser 9.54 0.44 - 1.00 mg/dL   Calcium  9.1 8.9 - 10.3 mg/dL   Total Protein 6.6 6.5 - 8.1 g/dL   Albumin 3.0 (L) 3.5 - 5.0 g/dL   AST 25 15 - 41 U/L  ALT 14 0 - 44 U/L   Alkaline Phosphatase 205 (H) 38 - 126 U/L   Total Bilirubin 0.6 0.0 - 1.2 mg/dL   GFR, Estimated >39 >39 mL/min    Comment: (NOTE) Calculated using the CKD-EPI Creatinine Equation (2021)    Anion gap 8 5 - 15    Comment: Performed at [facility], [address], [place], [place] [address]    I have reviewed the patient's current medications.  ASSESSMENT: Principal Problem:   Preeclampsia, third trimester Active Problems:   Herpes simplex vulvovaginitis   Supervision of high-risk pregnancy   [redacted] weeks gestation of pregnancy   PLAN: Blood pressure controlled with current regimen.  Bp is currently mild range Continue HSV  prophylaxis Nst reassuring but not reactive, start weekly BPP at 32 weeks   Continue routine antenatal care.   Jerilynn Buddle, MD [facility] Faculty Attending, Center for [facility] 11/25/2023 7:33 AM

## 2023-11-26 ENCOUNTER — Ambulatory Visit

## 2023-11-26 ENCOUNTER — Other Ambulatory Visit

## 2023-11-26 ENCOUNTER — Inpatient Hospital Stay (HOSPITAL_BASED_OUTPATIENT_CLINIC_OR_DEPARTMENT_OTHER)

## 2023-11-26 DIAGNOSIS — O98513 Other viral diseases complicating pregnancy, third trimester: Secondary | ICD-10-CM

## 2023-11-26 DIAGNOSIS — O133 Gestational [pregnancy-induced] hypertension without significant proteinuria, third trimester: Secondary | ICD-10-CM | POA: Diagnosis not present

## 2023-11-26 DIAGNOSIS — D563 Thalassemia minor: Secondary | ICD-10-CM

## 2023-11-26 DIAGNOSIS — B009 Herpesviral infection, unspecified: Secondary | ICD-10-CM

## 2023-11-26 DIAGNOSIS — Z3A31 31 weeks gestation of pregnancy: Secondary | ICD-10-CM

## 2023-11-26 DIAGNOSIS — O99013 Anemia complicating pregnancy, third trimester: Secondary | ICD-10-CM | POA: Diagnosis not present

## 2023-11-26 DIAGNOSIS — Z3480 Encounter for supervision of other normal pregnancy, unspecified trimester: Secondary | ICD-10-CM

## 2023-11-26 MED ORDER — LABETALOL HCL 300 MG PO TABS: 600.0000 mg | ORAL_TABLET | Freq: Three times a day (TID) | ORAL | 1 refills | Status: DC

## 2023-11-26 MED ORDER — AMLODIPINE BESYLATE 10 MG PO TABS: 10.0000 mg | ORAL_TABLET | Freq: Every day | ORAL | 1 refills | Status: DC

## 2023-11-26 MED ORDER — VALACYCLOVIR HCL 500 MG PO TABS: 500.0000 mg | ORAL_TABLET | Freq: Two times a day (BID) | ORAL | 1 refills | Status: DC

## 2023-11-26 NOTE — Progress Notes (Signed)
 FACULTY PRACTICE ANTEPARTUM PROGRESS NOTE  Margaret Harvey is a 23 y.o. G2P0010 at [redacted]w[redacted]d who is admitted for [facility] versus mild preeclampsia.  Estimated Date of Delivery: 01/22/24 Fetal presentation is unsure.  Length of Stay:  7 Days. Admitted 11/19/2023  Subjective: Pt resting quietly.  She denies headache, visual changes and RUQ pain. Patient reports normal fetal movement.  She denies uterine contractions, denies bleeding and leaking of fluid per vagina.  Vitals:  Blood pressure (!) 142/86, pulse 100, temperature 98.1 F (36.7 C), temperature source Oral, resp. rate 18, height 5' 2 (1.575 m), weight 87.7 kg, last menstrual period 03/10/2023, SpO2 100%. Physical Examination: CONSTITUTIONAL: Well-developed, well-nourished female in no acute distress.  HENT:  Normocephalic, atraumatic, External right and left ear normal. Oropharynx is clear and moist EYES: Conjunctivae and EOM are normal. NECK: Normal range of motion, supple, no masses. SKIN: Skin is warm and dry. No rash noted. Not diaphoretic. No erythema. No pallor. NEUROLGIC: Alert and oriented to person, place, and time. Normal reflexes, muscle tone coordination. No cranial nerve deficit noted. PSYCHIATRIC: Normal mood and affect. Normal behavior. Normal judgment and thought content. CARDIOVASCULAR: Normal heart rate noted, regular rhythm RESPIRATORY: Effort and breath sounds normal, no problems with respiration noted MUSCULOSKELETAL: Normal range of motion. No edema and no tenderness. ABDOMEN: Soft, nontender, nondistended, gravid. CERVIX: deferred  Fetal monitoring: FHR: 140s bpm, Variability: moderate, Accelerations: Present, Decelerations: Absent , nonreactive but reassuring Uterine activity: none  Results for orders placed or performed during the hospital encounter of 11/19/23 (from the past 48 hours)  Type and screen [facility]     Status: None   Collection Time: 11/25/23  4:22 AM  Result Value Ref Range    ABO/RH(D) O POS    Antibody Screen NEG    Sample Expiration      11/28/2023,2359 Performed at [facility], [address] [place], [place] [address]   CBC     Status: Abnormal   Collection Time: 11/25/23  4:27 AM  Result Value Ref Range   WBC 13.1 (H) 4.0 - 10.5 K/uL   RBC 3.79 (L) 3.87 - 5.11 MIL/uL   Hemoglobin 10.3 (L) 12.0 - 15.0 g/dL   HCT 68.0 (L) 63.9 - 53.9 %   MCV 84.2 80.0 - 100.0 fL   MCH 27.2 26.0 - 34.0 pg   MCHC 32.3 30.0 - 36.0 g/dL   RDW 86.5 88.4 - 84.4 %   Platelets 341 150 - 400 K/uL   nRBC 0.0 0.0 - 0.2 %    Comment: Performed at [facility], [address] [place], [place] [address]  Comprehensive metabolic panel     Status: Abnormal   Collection Time: 11/25/23  4:27 AM  Result Value Ref Range   Sodium 134 (L) 135 - 145 mmol/L   Potassium 3.7 3.5 - 5.1 mmol/L   Chloride 102 98 - 111 mmol/L   CO2 24 22 - 32 mmol/L   Glucose, Bld 120 (H) 70 - 99 mg/dL    Comment: Glucose reference range applies only to samples taken after fasting for at least 8 hours.   BUN 6 6 - 20 mg/dL   Creatinine, Ser 9.54 0.44 - 1.00 mg/dL   Calcium  9.1 8.9 - 10.3 mg/dL   Total Protein 6.6 6.5 - 8.1 g/dL   Albumin 3.0 (L) 3.5 - 5.0 g/dL   AST 25 15 - 41 U/L   ALT 14 0 - 44 U/L   Alkaline Phosphatase  205 (H) 38 - 126 U/L   Total Bilirubin 0.6 0.0 - 1.2 mg/dL   GFR, Estimated >39 >39 mL/min    Comment: (NOTE) Calculated using the CKD-EPI Creatinine Equation (2021)    Anion gap 8 5 - 15    Comment: Performed at [facility], [address] [place], [place] [address]    I have reviewed the patient's current medications.  ASSESSMENT: Principal Problem:   Preeclampsia, third trimester Active Problems:   Herpes simplex vulvovaginitis   Supervision of high-risk pregnancy   [redacted] weeks gestation of pregnancy   PLAN: GHTN versus preeclampsia without severe features, Mild rnage BP detected.  No end organ symptoms, BPP scheduled for tomorrow HSV: continue  prophylaxis Fetal well being:  NST reassuring but not reactive, BPP on 7/15 Disposition: will need to discuss with MFM continued inpatient management versus outpatient testing after BPP this week   Continue routine antenatal care.   Jerilynn Buddle, MD [facility] Faculty Attending, Center for [facility] 11/26/2023 6:56 AM

## 2023-11-26 NOTE — Progress Notes (Signed)
 Spoke with Dr. Ileana from MFM. Reviewed patient clinical course and current status. Per MFM recommendations, will do BPP today and then ok for outpatient management with weekly BPPs, weekly labs and close home blood pressure monitoring. Will get BPP and assess for discharge pending these results. Patient updated on plan of care.  Margaret ONEIDA Bring, MD, FACOG Obstetrician & Gynecologist, [facility], [facility]

## 2023-11-26 NOTE — Discharge Summary (Signed)
 Patient ID: Margaret Harvey MRN: [id number] DOB/AGE: 14-Feb-2001 22 y.o.  Admit date: 11/19/2023 Discharge date: 11/26/2023  Admission Diagnoses:gestational hypertension   Discharge Diagnoses: gestational hypertension  Prenatal Procedures: NST and BPP  and US   Consults: Neonatology, Maternal Fetal Medicine  Hospital Course:  This is a 24 y.o. G2P0010 with IUP at [redacted]w[redacted]d admitted for admitted for [facility] versus mild preeclampsia. She presented to maternity admissions reporting elevated Bps at home, blurry vision, nausea, and headache. Pregnancy c/b pre-eclampsia without severe features, HSV, alpha thalassemia silent carrier. Receives [facility].  She took Tylenol  at 1130 today with only transient benefit of her headache. Blood pressure controlled with current regimen. Bp is currently mild range  BPP 8/8. She was deemed stable for discharge to home with outpatient follow up.  Discharge Exam: Temp:  [98 F (36.7 C)-98.6 F (37 C)] 98.6 F (37 C) (07/14 1924) Pulse Rate:  [91-102] 100 (07/14 1924) Resp:  [16-18] 16 (07/14 1924) BP: (128-150)/(73-91) 128/73 (07/14 1924) SpO2:  [100 %] 100 % (07/14 1924) Physical Examination: CONSTITUTIONAL: Well-developed, well-nourished female in no acute distress.  HENT:  Normocephalic, atraumatic, External right and left ear normal. Oropharynx is clear and moist EYES: Conjunctivae and EOM are normal. Pupils are equal, round, and reactive to light. No scleral icterus.  NECK: Normal range of motion, supple, no masses SKIN: Skin is warm and dry. No rash noted. Not diaphoretic. No erythema. No pallor. NEUROLGIC: Alert and oriented to person, place, and time. Normal reflexes, muscle tone coordination. No cranial nerve deficit noted. PSYCHIATRIC: Normal mood and affect. Normal behavior. Normal judgment and thought content. CARDIOVASCULAR: Normal heart rate noted, regular rhythm RESPIRATORY: Effort and breath sounds normal, no problems with respiration  noted MUSCULOSKELETAL: Normal range of motion. No edema and no tenderness. 2+ distal pulses. ABDOMEN: Soft, nontender, nondistended, gravid. CERVIX: Dilation: Closed Effacement (%): Thick Station: -2 Exam by:: Dr. Ilean  Fetal monitoring: FHR: 150 bpm, Variability: moderate, Accelerations: Present 10 x 10, Decelerations: Absent   BPP 7/14 8/8 Significant Diagnostic Studies:  Results for orders placed or performed during the hospital encounter of 11/19/23 (from the past week)  Type and screen [facility]   Collection Time: 11/19/23  9:32 PM  Result Value Ref Range   ABO/RH(D) O POS    Antibody Screen NEG    Sample Expiration      11/22/2023,2359 Performed at [facility], [address], [place], [place] [address]   Protein, urine, 24 hour   Collection Time: 11/20/23 10:40 AM  Result Value Ref Range   Urine Total Volume-UPROT mL   Collection Interval-UPROT 24 hours   Protein, Urine <6 mg/dL   Protein, 75Y Urine        50 - 100 mg/day  Type and screen [facility]   Collection Time: 11/22/23  5:26 AM  Result Value Ref Range   ABO/RH(D) O POS    Antibody Screen NEG    Sample Expiration      11/25/2023,2359 Performed at [facility], [address], [place], [place] [address]   CBC   Collection Time: 11/22/23  5:31 AM  Result Value Ref Range   WBC 16.5 (H) 4.0 - 10.5 K/uL   RBC 3.70 (L) 3.87 - 5.11 MIL/uL   Hemoglobin 10.1 (L) 12.0 - 15.0 g/dL   HCT 69.1 (L) 63.9 - 53.9 %   MCV 83.2 80.0 - 100.0 fL   MCH 27.3 26.0 - 34.0 pg  MCHC 32.8 30.0 - 36.0 g/dL   RDW 86.5 88.4 - 84.4 %   Platelets 336 150 - 400 K/uL   nRBC 0.0 0.0 - 0.2 %  Comprehensive metabolic panel   Collection Time: 11/22/23  5:31 AM  Result Value Ref Range   Sodium 135 135 - 145 mmol/L   Potassium 3.5 3.5 - 5.1 mmol/L   Chloride 102 98 - 111 mmol/L   CO2 22 22 - 32 mmol/L   Glucose, Bld 117 (H) 70 - 99 mg/dL   BUN 5 (L) 6 - 20 mg/dL    Creatinine, Ser 9.51 0.44 - 1.00 mg/dL   Calcium  8.8 (L) 8.9 - 10.3 mg/dL   Total Protein 6.8 6.5 - 8.1 g/dL   Albumin 2.8 (L) 3.5 - 5.0 g/dL   AST 18 15 - 41 U/L   ALT 13 0 - 44 U/L   Alkaline Phosphatase 159 (H) 38 - 126 U/L   Total Bilirubin 0.2 0.0 - 1.2 mg/dL   GFR, Estimated >39 >39 mL/min   Anion gap 11 5 - 15  Vitamin B12   Collection Time: 11/22/23  5:31 AM  Result Value Ref Range   Vitamin B-12 205 180 - 914 pg/mL  Folate   Collection Time: 11/22/23  5:31 AM  Result Value Ref Range   Folate 16.9 >5.9 ng/mL  Iron and TIBC   Collection Time: 11/22/23  5:31 AM  Result Value Ref Range   Iron 186 (H) 28 - 170 ug/dL   TIBC 487 (H) 749 - 549 ug/dL   Saturation Ratios 36 (H) 10.4 - 31.8 %   UIBC 326 ug/dL  Ferritin   Collection Time: 11/22/23  5:31 AM  Result Value Ref Range   Ferritin 11 11 - 307 ng/mL  Reticulocytes   Collection Time: 11/22/23  5:31 AM  Result Value Ref Range   Retic Ct Pct 1.7 0.4 - 3.1 %   RBC. 3.68 (L) 3.87 - 5.11 MIL/uL   Retic Count, Absolute 63.3 19.0 - 186.0 K/uL   Immature Retic Fract 17.2 (H) 2.3 - 15.9 %  Type and screen [facility]   Collection Time: 11/25/23  4:22 AM  Result Value Ref Range   ABO/RH(D) O POS    Antibody Screen NEG    Sample Expiration      11/28/2023,2359 Performed at [facility], [address], [place], [place] [address]   CBC   Collection Time: 11/25/23  4:27 AM  Result Value Ref Range   WBC 13.1 (H) 4.0 - 10.5 K/uL   RBC 3.79 (L) 3.87 - 5.11 MIL/uL   Hemoglobin 10.3 (L) 12.0 - 15.0 g/dL   HCT 68.0 (L) 63.9 - 53.9 %   MCV 84.2 80.0 - 100.0 fL   MCH 27.2 26.0 - 34.0 pg   MCHC 32.3 30.0 - 36.0 g/dL   RDW 86.5 88.4 - 84.4 %   Platelets 341 150 - 400 K/uL   nRBC 0.0 0.0 - 0.2 %  Comprehensive metabolic panel   Collection Time: 11/25/23  4:27 AM  Result Value Ref Range   Sodium 134 (L) 135 - 145 mmol/L   Potassium 3.7 3.5 - 5.1 mmol/L   Chloride 102 98 - 111 mmol/L   CO2 24 22 - 32  mmol/L   Glucose, Bld 120 (H) 70 - 99 mg/dL   BUN 6 6 - 20 mg/dL   Creatinine, Ser 9.54 0.44 - 1.00 mg/dL   Calcium  9.1 8.9 - 10.3  mg/dL   Total Protein 6.6 6.5 - 8.1 g/dL   Albumin 3.0 (L) 3.5 - 5.0 g/dL   AST 25 15 - 41 U/L   ALT 14 0 - 44 U/L   Alkaline Phosphatase 205 (H) 38 - 126 U/L   Total Bilirubin 0.6 0.0 - 1.2 mg/dL   GFR, Estimated >39 >39 mL/min   Anion gap 8 5 - 15    Discharge Condition: Stable  Disposition: Discharge disposition: 01-Home or Self Care        Discharge Instructions     Discharge patient   Complete by: As directed    Discharge disposition: 01-Home or Self Care   Discharge patient date: 11/26/2023      Allergies as of 11/26/2023       Reactions   Other Anaphylaxis, Other (See Comments)   Almonds        Medication List     TAKE these medications    acetaminophen  325 MG tablet Commonly known as: TYLENOL  Take 500 mg by mouth every 6 (six) hours as needed.   albuterol  108 (90 Base) MCG/ACT inhaler Commonly known as: VENTOLIN  HFA Inhale 1-2 puffs into the lungs every 6 (six) hours as needed for wheezing or shortness of breath.   amLODipine  10 MG tablet Commonly known as: NORVASC  Take 1 tablet (10 mg total) by mouth daily. Start taking on: November 27, 2023   aspirin  EC 81 MG tablet Take 81 mg by mouth daily. Swallow whole.   cyclobenzaprine  10 MG tablet Commonly known as: FLEXERIL  Take 1 tablet (10 mg total) by mouth 2 (two) times daily as needed for muscle spasms.   docusate sodium  100 MG capsule Commonly known as: COLACE Take 100 mg by mouth 2 (two) times daily as needed for mild constipation.   escitalopram  10 MG tablet Commonly known as: LEXAPRO  Take 1 tablet (10 mg total) by mouth daily.   Excedrin  Tension Headache 500-65 MG Tabs per tablet Generic drug: acetaminophen -caffeine  Take 1 tablet by mouth as needed.   fexofenadine  180 MG tablet Commonly known as: ALLEGRA  Take 1 tablet (180 mg total) by mouth daily.    fluticasone  44 MCG/ACT inhaler Commonly known as: FLOVENT  HFA Inhale 2 puffs into the lungs 2 (two) times daily.   fluticasone  50 MCG/ACT nasal spray Commonly known as: FLONASE  Place 2 sprays into both nostrils daily.   Labetalol  HCl 400 MG Tabs Take 400 mg by mouth 3 (three) times daily. Space out doses so there is one every 8 hours. What changed: Another medication with the same name was added. Make sure you understand how and when to take each.   labetalol  300 MG tablet Commonly known as: NORMODYNE  Take 2 tablets (600 mg total) by mouth 3 (three) times daily. What changed: You were already taking a medication with the same name, and this prescription was added. Make sure you understand how and when to take each.   PRENATAL VITAMINS PO Take by mouth.   UNISOM  PO Take 1 tablet by mouth as needed (in evening).   valACYclovir  500 MG tablet Commonly known as: VALTREX  Take 1 tablet (500 mg total) by mouth 2 (two) times daily.        Follow-up Information     [facility]              Follow up.   Why: for routinely scheduled OB appointment Contact information:    Signed: Lynwood Solomons M.D. 11/26/2023, 8:48 PM

## 2023-11-26 NOTE — Progress Notes (Signed)
 Pt discharged with significant other. Discharge instructions, medications, and follow-up instructions reviewed. Pt had no questions.  [place] Lestine Rahe, RN

## 2023-11-27 NOTE — Progress Notes (Signed)
 Situation:  Member is not enrolled in care management.  Background:  Member identified as high risk following 11/19/23-11/26/23 [facility] inpatient admission for pre-eclampsia in third trimester. EDD 01/22/24   Assessment:  ACN contacted member introduced herself and explained role and services.  Member verbalizes that she is feeling well, denies headache, leakage of fluids, or contractions.  Member has blood pressure cuffs at home and is monitoring blood pressure daily, todays reading 146/106 before medications and 137/95 after medications.  Member is scheduled for prenatal visit on 11/28/23.  Members Labetalol  was increase to 600 mg three times daily, member reports that she has medication and no questions.   Member is not aware of added benefits of health plan.  Member declines need for further care management.  Recommendation:  ACN reviewed AVS with member.  Member is aware of red flags for her condition.  If further needs arise a new referral can be sent for care management.  ACN made member aware of  letter being sent via members [facility] patient portal with added benefits and ACNs contact information.  Tawni Louder, BSN, RN  Ambulatory Care Navigator  [phone]

## 2023-11-28 ENCOUNTER — Ambulatory Visit: Admitting: Obstetrics and Gynecology

## 2023-11-28 ENCOUNTER — Other Ambulatory Visit (HOSPITAL_COMMUNITY): Payer: Self-pay

## 2023-11-28 ENCOUNTER — Inpatient Hospital Stay (HOSPITAL_COMMUNITY)

## 2023-11-28 ENCOUNTER — Other Ambulatory Visit: Payer: Self-pay

## 2023-11-28 ENCOUNTER — Inpatient Hospital Stay (HOSPITAL_COMMUNITY)
Admission: AD | Admit: 2023-11-28 | Discharge: 2023-12-04 | DRG: 787 | Disposition: A | Discharge status: Home / Self Care | Payer: Self-pay | Attending: Obstetrics and Gynecology | Admitting: Obstetrics and Gynecology

## 2023-11-28 ENCOUNTER — Encounter (HOSPITAL_COMMUNITY): Payer: Self-pay | Admitting: Obstetrics and Gynecology

## 2023-11-28 VITALS — BP 146/87 | HR 102 | Wt 192.0 lb

## 2023-11-28 DIAGNOSIS — Z148 Genetic carrier of other disease: Secondary | ICD-10-CM

## 2023-11-28 DIAGNOSIS — O99013 Anemia complicating pregnancy, third trimester: Secondary | ICD-10-CM | POA: Diagnosis not present

## 2023-11-28 DIAGNOSIS — A6004 Herpesviral vulvovaginitis: Secondary | ICD-10-CM | POA: Diagnosis not present

## 2023-11-28 DIAGNOSIS — O289 Unspecified abnormal findings on antenatal screening of mother: Secondary | ICD-10-CM | POA: Diagnosis not present

## 2023-11-28 DIAGNOSIS — O9832 Other infections with a predominantly sexual mode of transmission complicating childbirth: Secondary | ICD-10-CM | POA: Diagnosis present

## 2023-11-28 DIAGNOSIS — O9952 Diseases of the respiratory system complicating childbirth: Secondary | ICD-10-CM | POA: Diagnosis present

## 2023-11-28 DIAGNOSIS — Z3A32 32 weeks gestation of pregnancy: Secondary | ICD-10-CM

## 2023-11-28 DIAGNOSIS — O1414 Severe pre-eclampsia complicating childbirth: Secondary | ICD-10-CM | POA: Diagnosis present

## 2023-11-28 DIAGNOSIS — D563 Thalassemia minor: Secondary | ICD-10-CM | POA: Diagnosis present

## 2023-11-28 DIAGNOSIS — O288 Other abnormal findings on antenatal screening of mother: Secondary | ICD-10-CM | POA: Diagnosis present

## 2023-11-28 DIAGNOSIS — A6000 Herpesviral infection of urogenital system, unspecified: Secondary | ICD-10-CM | POA: Diagnosis present

## 2023-11-28 DIAGNOSIS — Z7982 Long term (current) use of aspirin: Secondary | ICD-10-CM | POA: Diagnosis not present

## 2023-11-28 DIAGNOSIS — F411 Generalized anxiety disorder: Secondary | ICD-10-CM | POA: Diagnosis present

## 2023-11-28 DIAGNOSIS — O0993 Supervision of high risk pregnancy, unspecified, third trimester: Principal | ICD-10-CM

## 2023-11-28 DIAGNOSIS — Z7951 Long term (current) use of inhaled steroids: Secondary | ICD-10-CM | POA: Diagnosis not present

## 2023-11-28 DIAGNOSIS — O99344 Other mental disorders complicating childbirth: Secondary | ICD-10-CM | POA: Diagnosis present

## 2023-11-28 DIAGNOSIS — B009 Herpesviral infection, unspecified: Secondary | ICD-10-CM

## 2023-11-28 DIAGNOSIS — O1493 Unspecified pre-eclampsia, third trimester: Secondary | ICD-10-CM

## 2023-11-28 DIAGNOSIS — F332 Major depressive disorder, recurrent severe without psychotic features: Secondary | ICD-10-CM | POA: Diagnosis present

## 2023-11-28 DIAGNOSIS — O139 Gestational [pregnancy-induced] hypertension without significant proteinuria, unspecified trimester: Secondary | ICD-10-CM | POA: Diagnosis present

## 2023-11-28 DIAGNOSIS — Z79899 Other long term (current) drug therapy: Secondary | ICD-10-CM

## 2023-11-28 DIAGNOSIS — O1410 Severe pre-eclampsia, unspecified trimester: Secondary | ICD-10-CM | POA: Diagnosis present

## 2023-11-28 DIAGNOSIS — O133 Gestational [pregnancy-induced] hypertension without significant proteinuria, third trimester: Secondary | ICD-10-CM

## 2023-11-28 DIAGNOSIS — O98513 Other viral diseases complicating pregnancy, third trimester: Secondary | ICD-10-CM

## 2023-11-28 DIAGNOSIS — O1413 Severe pre-eclampsia, third trimester: Secondary | ICD-10-CM | POA: Diagnosis not present

## 2023-11-28 DIAGNOSIS — R030 Elevated blood-pressure reading, without diagnosis of hypertension: Secondary | ICD-10-CM | POA: Diagnosis present

## 2023-11-28 DIAGNOSIS — G43009 Migraine without aura, not intractable, without status migrainosus: Secondary | ICD-10-CM | POA: Diagnosis present

## 2023-11-28 DIAGNOSIS — Z98891 History of uterine scar from previous surgery: Secondary | ICD-10-CM

## 2023-11-28 DIAGNOSIS — J45909 Unspecified asthma, uncomplicated: Secondary | ICD-10-CM | POA: Diagnosis present

## 2023-11-28 HISTORY — DX: Unspecified asthma, uncomplicated: J45.909

## 2023-11-28 LAB — PROTEIN / CREATININE RATIO, URINE
Creatinine, Urine: 45 mg/dL
Protein Creatinine Ratio: 0.16 mg/mg{creat} — ABNORMAL HIGH (ref 0.00–0.15)
Total Protein, Urine: 7 mg/dL

## 2023-11-28 LAB — CBC
HCT: 31.8 % — ABNORMAL LOW (ref 36.0–46.0)
Hemoglobin: 10.4 g/dL — ABNORMAL LOW (ref 12.0–15.0)
MCH: 27.2 pg (ref 26.0–34.0)
MCHC: 32.7 g/dL (ref 30.0–36.0)
MCV: 83.2 fL (ref 80.0–100.0)
Platelets: 310 K/uL (ref 150–400)
RBC: 3.82 MIL/uL — ABNORMAL LOW (ref 3.87–5.11)
RDW: 14 % (ref 11.5–15.5)
WBC: 12.1 K/uL — ABNORMAL HIGH (ref 4.0–10.5)
nRBC: 0 % (ref 0.0–0.2)

## 2023-11-28 LAB — COMPREHENSIVE METABOLIC PANEL WITH GFR
ALT: 14 U/L (ref 0–44)
AST: 16 U/L (ref 15–41)
Albumin: 3.1 g/dL — ABNORMAL LOW (ref 3.5–5.0)
Alkaline Phosphatase: 213 U/L — ABNORMAL HIGH (ref 38–126)
Anion gap: 10 (ref 5–15)
BUN: 6 mg/dL (ref 6–20)
CO2: 24 mmol/L (ref 22–32)
Calcium: 9.2 mg/dL (ref 8.9–10.3)
Chloride: 102 mmol/L (ref 98–111)
Creatinine, Ser: 0.46 mg/dL (ref 0.44–1.00)
GFR, Estimated: >60 mL/min (ref >60)
Glucose, Bld: 94 mg/dL (ref 70–99)
Potassium: 3.4 mmol/L — ABNORMAL LOW (ref 3.5–5.1)
Sodium: 136 mmol/L (ref 135–145)
Total Bilirubin: 0.4 mg/dL (ref 0.0–1.2)
Total Protein: 6.9 g/dL (ref 6.5–8.1)

## 2023-11-28 LAB — TYPE AND SCREEN
ABO/RH(D): O POS
Antibody Screen: NEGATIVE

## 2023-11-28 MED ORDER — AMLODIPINE BESYLATE 5 MG PO TABS
10.0000 mg | ORAL_TABLET | Freq: Every day | ORAL | Status: DC
  Administered 2023-11-29 – 2023-12-04 (×6): 10 mg via ORAL
  Filled 2023-11-28: qty 2
  Filled 2023-11-28: qty 1
  Filled 2023-11-28 (×2): qty 2
  Filled 2023-11-28: qty 1
  Filled 2023-11-28 (×2): qty 2

## 2023-11-28 MED ORDER — LABETALOL HCL 200 MG PO TABS
800.0000 mg | ORAL_TABLET | Freq: Three times a day (TID) | ORAL | Status: DC
  Administered 2023-11-28 – 2023-11-30 (×7): 800 mg via ORAL
  Filled 2023-11-28 (×7): qty 4

## 2023-11-28 MED ORDER — ESCITALOPRAM OXALATE 10 MG PO TABS
10.0000 mg | ORAL_TABLET | Freq: Once | ORAL | Status: AC
  Administered 2023-11-28: 10 mg via ORAL
  Filled 2023-11-28: qty 1

## 2023-11-28 MED ORDER — ALBUTEROL SULFATE (2.5 MG/3ML) 0.083% IN NEBU
3.0000 mL | INHALATION_SOLUTION | Freq: Four times a day (QID) | RESPIRATORY_TRACT | Status: DC | PRN
  Administered 2023-11-29: 3 mL via RESPIRATORY_TRACT
  Filled 2023-11-28: qty 3

## 2023-11-28 MED ORDER — DOCUSATE SODIUM 100 MG PO CAPS: 100.0000 mg | ORAL_CAPSULE | Freq: Every day | ORAL | Status: DC

## 2023-11-28 MED ORDER — ACETAMINOPHEN 325 MG PO TABS: 650.0000 mg | ORAL_TABLET | ORAL | Status: DC | PRN

## 2023-11-28 MED ORDER — VALACYCLOVIR HCL 500 MG PO TABS
500.0000 mg | ORAL_TABLET | Freq: Two times a day (BID) | ORAL | Status: DC
  Administered 2023-11-28 – 2023-11-30 (×5): 500 mg via ORAL
  Filled 2023-11-28 (×5): qty 1

## 2023-11-28 MED ORDER — LACTATED RINGERS IV BOLUS: 1000.0000 mL | Freq: Once | INTRAVENOUS | Status: DC

## 2023-11-28 MED ORDER — LACTATED RINGERS IV SOLN: 125.0000 mL/h | INTRAVENOUS | Status: DC

## 2023-11-28 MED ORDER — CALCIUM CARBONATE ANTACID 500 MG PO CHEW
2.0000 | CHEWABLE_TABLET | ORAL | Status: DC | PRN
  Administered 2023-11-28: 400 mg via ORAL
  Filled 2023-11-28: qty 2

## 2023-11-28 MED ORDER — ACETAMINOPHEN-CAFFEINE 500-65 MG PO TABS
2.0000 | ORAL_TABLET | Freq: Once | ORAL | Status: AC
  Administered 2023-11-28: 2 via ORAL
  Filled 2023-11-28: qty 2

## 2023-11-28 MED ORDER — DOCUSATE SODIUM 100 MG PO CAPS: 100.0000 mg | ORAL_CAPSULE | Freq: Two times a day (BID) | ORAL | Status: DC | PRN

## 2023-11-28 MED ORDER — CYCLOBENZAPRINE HCL 5 MG PO TABS
10.0000 mg | ORAL_TABLET | Freq: Two times a day (BID) | ORAL | Status: DC | PRN
  Administered 2023-11-28: 10 mg via ORAL
  Filled 2023-11-28: qty 1

## 2023-11-28 MED ORDER — ENOXAPARIN SODIUM 40 MG/0.4ML IJ SOSY
40.0000 mg | PREFILLED_SYRINGE | INTRAMUSCULAR | Status: DC
  Filled 2023-11-28: qty 0.4

## 2023-11-28 MED ORDER — LORATADINE 10 MG PO TABS
10.0000 mg | ORAL_TABLET | Freq: Every evening | ORAL | Status: DC
  Administered 2023-11-28 – 2023-11-29 (×2): 10 mg via ORAL
  Filled 2023-11-28 (×2): qty 1

## 2023-11-28 MED ORDER — PRENATAL MULTIVITAMIN CH: 1.0000 | ORAL_TABLET | Freq: Every day | ORAL | Status: DC

## 2023-11-28 MED ORDER — FLUTICASONE PROPIONATE 50 MCG/ACT NA SUSP
2.0000 | Freq: Every day | NASAL | Status: DC
  Filled 2023-11-28: qty 16

## 2023-11-28 MED ORDER — DOCUSATE SODIUM 100 MG PO CAPS
100.0000 mg | ORAL_CAPSULE | Freq: Every day | ORAL | Status: DC
  Administered 2023-11-29: 100 mg via ORAL
  Filled 2023-11-28 (×2): qty 1

## 2023-11-28 MED ORDER — ACETAMINOPHEN 500 MG PO TABS: 500.0000 mg | ORAL_TABLET | Freq: Four times a day (QID) | ORAL | Status: DC | PRN

## 2023-11-28 MED ORDER — PRENATAL MULTIVITAMIN CH
1.0000 | ORAL_TABLET | Freq: Every day | ORAL | Status: DC
  Filled 2023-11-28: qty 1

## 2023-11-28 MED ORDER — BUDESONIDE 0.25 MG/2ML IN SUSP
0.2500 mg | Freq: Two times a day (BID) | RESPIRATORY_TRACT | Status: DC
  Filled 2023-11-28 (×4): qty 2

## 2023-11-28 MED ORDER — DOXYLAMINE SUCCINATE (SLEEP) 25 MG PO TABS
25.0000 mg | ORAL_TABLET | Freq: Every evening | ORAL | Status: DC | PRN
  Administered 2023-11-28: 25 mg via ORAL
  Filled 2023-11-28: qty 1

## 2023-11-28 MED ORDER — PROMETHAZINE HCL 25 MG PO TABS
25.0000 mg | ORAL_TABLET | Freq: Three times a day (TID) | ORAL | Status: DC | PRN
  Administered 2023-11-28 – 2023-11-29 (×3): 25 mg via ORAL
  Filled 2023-11-28 (×3): qty 1

## 2023-11-28 MED ORDER — PRENATAL VITAMINS 27-0.8 MG PO TABS: 1.0000 | ORAL_TABLET | Freq: Every day | ORAL | Status: DC

## 2023-11-28 MED ORDER — LABETALOL HCL 100 MG PO TABS
800.0000 mg | ORAL_TABLET | Freq: Once | ORAL | Status: AC
  Administered 2023-11-28: 800 mg via ORAL
  Filled 2023-11-28: qty 8

## 2023-11-28 MED ORDER — SODIUM CHLORIDE 0.9% FLUSH: 3.0000 mL | INTRAVENOUS | Status: DC | PRN

## 2023-11-28 MED ORDER — SODIUM CHLORIDE 0.9% FLUSH
3.0000 mL | Freq: Two times a day (BID) | INTRAVENOUS | Status: DC
  Administered 2023-11-28 – 2023-11-29 (×2): 10 mL via INTRAVENOUS

## 2023-11-28 MED ORDER — ASPIRIN 81 MG PO TBEC
81.0000 mg | DELAYED_RELEASE_TABLET | Freq: Every day | ORAL | Status: DC
  Administered 2023-11-29: 81 mg via ORAL
  Filled 2023-11-28: qty 1

## 2023-11-28 MED ORDER — ESCITALOPRAM OXALATE 10 MG PO TABS
10.0000 mg | ORAL_TABLET | Freq: Every evening | ORAL | Status: DC
  Administered 2023-11-29 – 2023-11-30 (×2): 10 mg via ORAL
  Filled 2023-11-28 (×4): qty 1

## 2023-11-28 NOTE — MAU Note (Signed)
 Margaret Harvey is a 22yo G2P0 who presents to MAU, sent over from office with elevated BP and c/o ongoing HA. Pt state she is seeing floaters, denies RUQ pain, +2 DTR, - clonus, trace edema (non pitting) in bilateral lower extremities. + FM but pt states it has been less than normal since she started taking BP medication last week. Pt has taken PO labetalol  600mg  and amlodipine  10mg  at 1000 this morning. Denies CTX, vaginal bleeding, and LOF.  [name] .2:21 PM

## 2023-11-28 NOTE — H&P (Signed)
 History     CSN: [id number]  Arrival date and time: 11/28/23 1349   Event Date/Time   First Provider Initiated Contact with Patient 11/28/23 1446      Chief Complaint  Patient presents with   Hypertension   Headache   HPI [name] is a 23 y.o. year old G2P0010 female at [redacted]w[redacted]d weeks gestation who was sent to MAU from the office reporting elevated BP and H/A. She reports seeing floaters. She denies RUQ pain. She reports trace edema in bilateral extremities. She reports (+) FM, but less than normal since she started taking HTN meds last week. Her HTN meds were increased today, but she had not had the chance to take the increased dose before coming to the hospital. She had Labetalol  600 mg and Amlodipine  10 mg  at 1000 this AM. She states that Dr. Erik wanted her to have 200 mg of Labetalol  when she got here. She denies UC's, VB or LOF. She was recently admitted to [facility] on 7/7 and d/c'd home on 7/14. She receives [facility] with [facility]; next appt is 11/30/2023.   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 (d/w'd ID)     Hypertension    Seasonal allergies     Past Surgical History:  Procedure Laterality Date   ABCESS DRAINAGE     BREAST REDUCTION SURGERY     DILATION AND EVACUATION  05/2022    Family History  Problem Relation Age of Onset   Sudden death Neg Hx    Hypertension Neg Hx    Hyperlipidemia Neg Hx    Heart attack Neg Hx    Diabetes Neg Hx    Migraines Neg Hx    Seizures Neg Hx    Depression Neg Hx    Anxiety disorder Neg Hx    ADD / ADHD  Neg Hx    Autism Neg Hx    Bipolar disorder Neg Hx    Schizophrenia Neg Hx     Social History   Tobacco Use   Smoking status: Never   Smokeless tobacco: Never  Vaping Use   Vaping status: Former  Substance Use Topics   Alcohol use: Not Currently    Comment: occ   Drug use: Not Currently    Types: Marijuana    Allergies:  Allergies  Allergen Reactions   Other Anaphylaxis and Other (See Comments)    Almonds    Medications Prior to Admission  Medication Sig Dispense Refill Last Dose/Taking   acetaminophen  (TYLENOL ) 325 MG tablet Take 500 mg by mouth every 6 (six) hours as needed.   Past Week   acetaminophen -caffeine  (EXCEDRIN  TENSION HEADACHE) 500-65 MG TABS per tablet Take 1 tablet by mouth as needed. 30 tablet 1 Past Week  albuterol  (VENTOLIN  HFA) 108 (90 Base) MCG/ACT inhaler Inhale 1-2 puffs into the lungs every 6 (six) hours as needed for wheezing or shortness of breath. 1 each 2 11/28/2023 Noon   amLODipine  (NORVASC ) 10 MG tablet Take 1 tablet (10 mg total) by mouth daily. 30 tablet 1 11/28/2023 Morning   aspirin  EC 81 MG tablet Take 81 mg by mouth daily. Swallow whole.   11/28/2023 Morning   cyclobenzaprine  (FLEXERIL ) 10 MG tablet Take 1 tablet (10 mg total) by mouth 2 (two) times daily as needed for muscle spasms. 20 tablet 0 11/27/2023 Morning   docusate sodium  (COLACE) 100 MG capsule Take 100 mg by mouth 2 (two) times daily as needed for mild constipation.   11/27/2023 Bedtime   Doxylamine  Succinate, Sleep, (UNISOM  PO) Take 1 tablet by mouth as needed (in evening).   11/27/2023 Bedtime   escitalopram  (LEXAPRO ) 10 MG tablet Take 1 tablet (10 mg total) by mouth daily. 90 tablet 0 11/27/2023 Evening   fexofenadine  (ALLEGRA ) 180 MG tablet Take 1 tablet (180 mg total) by mouth daily. 90 tablet 1 11/27/2023 Bedtime   labetalol  (NORMODYNE ) 300 MG tablet Take 2 tablets (600 mg total) by mouth 3 (three) times daily. 90 tablet 1 11/28/2023 Morning   Labetalol  HCl 400 MG TABS Take 400 mg  by mouth 3 (three) times daily. Space out doses so there is one every 8 hours. 90 tablet 2 11/28/2023 Morning   Prenatal Vit-Fe Fumarate-FA (PRENATAL VITAMINS PO) Take by mouth.   11/27/2023 Bedtime   valACYclovir  (VALTREX ) 500 MG tablet Take 1 tablet (500 mg total) by mouth 2 (two) times daily. 40 tablet 1 11/28/2023 Morning   fluticasone  (FLONASE ) 50 MCG/ACT nasal spray Place 2 sprays into both nostrils daily. 64 mL 2 More than a month   fluticasone  (FLOVENT  HFA) 44 MCG/ACT inhaler Inhale 2 puffs into the lungs 2 (two) times daily. 1 each 12 More than a month    Review of Systems  Constitutional: Negative.   HENT: Negative.    Eyes: Negative.   Respiratory: Negative.    Cardiovascular: Negative.   Gastrointestinal: Negative.   Endocrine: Negative.   Musculoskeletal: Negative.   Skin: Negative.   Allergic/Immunologic: Negative.   Neurological:  Positive for headaches.  Hematological: Negative.   Psychiatric/Behavioral: Negative.     Physical Exam   Patient Vitals for the past 24 hrs:  BP Temp Temp src Pulse Resp SpO2 Height Weight  11/28/23 1445 (!) 150/82 -- -- (!) 104 -- 100 % -- --  11/28/23 1430 (!) 148/85 -- -- (!) 106 -- 99 % -- --  11/28/23 1415 (!) 155/88 -- -- (!) 108 -- 100 % -- --  11/28/23 1414 -- 97.7 F (36.5 C) Oral -- 17 -- -- --  11/28/23 1408 -- -- -- -- -- -- 5' 2 (1.575 m) 87.1 kg    Physical Exam Vitals and nursing note reviewed.  Constitutional:      Appearance: Normal appearance. She is normal weight.  Cardiovascular:     Rate and Rhythm: Normal rate and regular rhythm.     Pulses: Normal pulses.     Heart sounds: Normal heart sounds.  Pulmonary:     Effort: Pulmonary effort is normal.     Breath sounds: Normal breath sounds.  Abdominal:     Palpations: Abdomen is soft.  Genitourinary:    Comments: Not indicated Musculoskeletal:        General: Normal range of motion.  Skin:    General: Skin is  warm and dry.  Neurological:     Mental Status:  She is alert and oriented to person, place, and time.  Psychiatric:        Mood and Affect: Mood normal.        Behavior: Behavior normal.        Thought Content: Thought content normal.        Judgment: Judgment normal.    REACTIVE NST - FHR: 150 bpm / moderate variability / accels present / decels absent / TOCO: UI noted MAU Course  Procedures  MDM CCUA CBC CMP P/C Ratio Serial BP's   Results for orders placed or performed during the hospital encounter of 11/28/23 (from the past 24 hours)  Protein / creatinine ratio, urine     Status: Abnormal   Collection Time: 11/28/23  2:13 PM  Result Value Ref Range   Creatinine, Urine 45 mg/dL   Total Protein, Urine 7 mg/dL   Protein Creatinine Ratio 0.16 (H) 0.00 - 0.15 mg/mg[Cre]  CBC     Status: Abnormal   Collection Time: 11/28/23  3:17 PM  Result Value Ref Range   WBC 12.1 (H) 4.0 - 10.5 K/uL   RBC 3.82 (L) 3.87 - 5.11 MIL/uL   Hemoglobin 10.4 (L) 12.0 - 15.0 g/dL   HCT 68.1 (L) 63.9 - 53.9 %   MCV 83.2 80.0 - 100.0 fL   MCH 27.2 26.0 - 34.0 pg   MCHC 32.7 30.0 - 36.0 g/dL   RDW 85.9 88.4 - 84.4 %   Platelets 310 150 - 400 K/uL   nRBC 0.0 0.0 - 0.2 %  Comprehensive metabolic panel with GFR     Status: Abnormal   Collection Time: 11/28/23  3:17 PM  Result Value Ref Range   Sodium 136 135 - 145 mmol/L   Potassium 3.4 (L) 3.5 - 5.1 mmol/L   Chloride 102 98 - 111 mmol/L   CO2 24 22 - 32 mmol/L   Glucose, Bld 94 70 - 99 mg/dL   BUN 6 6 - 20 mg/dL   Creatinine, Ser 9.53 0.44 - 1.00 mg/dL   Calcium  9.2 8.9 - 10.3 mg/dL   Total Protein 6.9 6.5 - 8.1 g/dL   Albumin 3.1 (L) 3.5 - 5.0 g/dL   AST 16 15 - 41 U/L   ALT 14 0 - 44 U/L   Alkaline Phosphatase 213 (H) 38 - 126 U/L   Total Bilirubin 0.4 0.0 - 1.2 mg/dL   GFR, Estimated >39 >39 mL/min   Anion gap 10 5 - 15  Type and screen [facility]     Status: None (Preliminary result)   Collection Time: 11/28/23  7:41 PM  Result Value Ref Range   ABO/RH(D)  PENDING    Antibody Screen PENDING    Sample Expiration      12/01/2023,2359 Performed at [facility], [address] [place], [place] [address]      *Consult with Dr. Abigail @ [phone] - notified of patient's complaints, assessments, lab & U/S results, tx plan admit to [facility] for HTN mgmt - agrees with plan  Assessment and Plan  1.  Preeclampsia, third trimester 2. 32 weeks pregnancy  - Admit to OBSCU  - Antenatal orders - Orders entered by attending MD    Ala Cart, CNM 11/28/2023, 2:46 PM

## 2023-11-29 ENCOUNTER — Encounter (HOSPITAL_COMMUNITY): Payer: Self-pay

## 2023-11-29 ENCOUNTER — Inpatient Hospital Stay (HOSPITAL_COMMUNITY)

## 2023-11-29 ENCOUNTER — Encounter (HOSPITAL_COMMUNITY): Payer: Self-pay | Admitting: Obstetrics & Gynecology

## 2023-11-29 DIAGNOSIS — O133 Gestational [pregnancy-induced] hypertension without significant proteinuria, third trimester: Secondary | ICD-10-CM | POA: Diagnosis not present

## 2023-11-29 DIAGNOSIS — O288 Other abnormal findings on antenatal screening of mother: Secondary | ICD-10-CM

## 2023-11-29 DIAGNOSIS — O289 Unspecified abnormal findings on antenatal screening of mother: Secondary | ICD-10-CM

## 2023-11-29 DIAGNOSIS — Z3A32 32 weeks gestation of pregnancy: Secondary | ICD-10-CM

## 2023-11-29 DIAGNOSIS — O1413 Severe pre-eclampsia, third trimester: Secondary | ICD-10-CM | POA: Diagnosis not present

## 2023-11-29 DIAGNOSIS — O98513 Other viral diseases complicating pregnancy, third trimester: Secondary | ICD-10-CM

## 2023-11-29 DIAGNOSIS — B009 Herpesviral infection, unspecified: Secondary | ICD-10-CM | POA: Diagnosis not present

## 2023-11-29 DIAGNOSIS — Z124 Encounter for screening for malignant neoplasm of cervix: Secondary | ICD-10-CM

## 2023-11-29 DIAGNOSIS — O358XX0 Maternal care for other (suspected) fetal abnormality and damage, not applicable or unspecified: Secondary | ICD-10-CM

## 2023-11-29 MED ORDER — MAGNESIUM SULFATE 40 GM/1000ML IV SOLN
2.0000 g/h | INTRAVENOUS | Status: DC
  Administered 2023-11-29 – 2023-11-30 (×2): 2 g/h via INTRAVENOUS
  Filled 2023-11-29 (×2): qty 1000

## 2023-11-29 MED ORDER — LIDOCAINE HCL (PF) 1 % IJ SOLN
30.0000 mL | INTRAMUSCULAR | Status: DC | PRN
Start: 2023-11-29 — End: 2023-12-01

## 2023-11-29 MED ORDER — ONDANSETRON HCL 4 MG/2ML IJ SOLN
4.0000 mg | Freq: Four times a day (QID) | INTRAMUSCULAR | Status: DC | PRN
Start: 2023-11-29 — End: 2023-12-01
  Administered 2023-11-30: 4 mg via INTRAVENOUS
  Filled 2023-11-29: qty 2

## 2023-11-29 MED ORDER — EPHEDRINE 5 MG/ML INJ
10.0000 mg | INTRAVENOUS | Status: AC | PRN
Start: 2023-11-29 — End: ?

## 2023-11-29 MED ORDER — FENTANYL CITRATE (PF) 100 MCG/2ML IJ SOLN
50.0000 ug | INTRAMUSCULAR | Status: DC | PRN
  Administered 2023-11-30 (×4): 100 ug via INTRAVENOUS
  Administered 2023-11-30: 50 ug via INTRAVENOUS
  Administered 2023-11-30 – 2023-12-01 (×4): 100 ug via INTRAVENOUS
  Filled 2023-11-29 (×11): qty 2

## 2023-11-29 MED ORDER — EPHEDRINE 5 MG/ML INJ: 10.0000 mg | INTRAVENOUS | Status: DC | PRN

## 2023-11-29 MED ORDER — ACETAMINOPHEN 325 MG PO TABS: 650.0000 mg | ORAL_TABLET | ORAL | Status: DC | PRN

## 2023-11-29 MED ORDER — PHENYLEPHRINE 80 MCG/ML (10ML) SYRINGE FOR IV PUSH (FOR BLOOD PRESSURE SUPPORT): 80.0000 ug | PREFILLED_SYRINGE | INTRAVENOUS | Status: DC | PRN

## 2023-11-29 MED ORDER — LACTATED RINGERS IV SOLN: INTRAVENOUS | Status: DC

## 2023-11-29 MED ORDER — BUTALBITAL-APAP-CAFFEINE 50-325-40 MG PO TABS
1.0000 | ORAL_TABLET | Freq: Once | ORAL | Status: AC
  Administered 2023-11-29: 1 via ORAL
  Filled 2023-11-29: qty 1

## 2023-11-29 MED ORDER — LABETALOL HCL 5 MG/ML IV SOLN: 40.0000 mg | INTRAVENOUS | Status: DC | PRN

## 2023-11-29 MED ORDER — OXYTOCIN BOLUS FROM INFUSION: 333.0000 mL | Freq: Once | INTRAVENOUS | Status: DC

## 2023-11-29 MED ORDER — LACTATED RINGERS IV SOLN
500.0000 mL | Freq: Once | INTRAVENOUS | Status: DC
Start: 2023-11-29 — End: 2023-12-01

## 2023-11-29 MED ORDER — TERBUTALINE SULFATE 1 MG/ML IJ SOLN
0.2500 mg | Freq: Once | INTRAMUSCULAR | Status: AC | PRN
Start: 2023-11-29 — End: ?

## 2023-11-29 MED ORDER — MAGNESIUM SULFATE BOLUS VIA INFUSION
4.0000 g | Freq: Once | INTRAVENOUS | Status: AC
  Administered 2023-11-29: 4 g via INTRAVENOUS
  Filled 2023-11-29: qty 1000

## 2023-11-29 MED ORDER — HYDRALAZINE HCL 20 MG/ML IJ SOLN: 10.0000 mg | INTRAMUSCULAR | Status: DC | PRN

## 2023-11-29 MED ORDER — SODIUM CHLORIDE 0.9% FLUSH: 3.0000 mL | Freq: Two times a day (BID) | INTRAVENOUS | Status: DC

## 2023-11-29 MED ORDER — SODIUM CHLORIDE 0.9 % IV SOLN: 250.0000 mL | INTRAVENOUS | Status: AC | PRN

## 2023-11-29 MED ORDER — PENICILLIN G POT IN DEXTROSE 60000 UNIT/ML IV SOLN
3.0000 10*6.[IU] | INTRAVENOUS | Status: DC
  Administered 2023-11-29 – 2023-11-30 (×6): 3 10*6.[IU] via INTRAVENOUS
  Filled 2023-11-29 (×6): qty 50

## 2023-11-29 MED ORDER — SODIUM CHLORIDE 0.9% FLUSH: 3.0000 mL | INTRAVENOUS | Status: DC | PRN

## 2023-11-29 MED ORDER — SOD CITRATE-CITRIC ACID 500-334 MG/5ML PO SOLN
30.0000 mL | ORAL | Status: DC | PRN
  Filled 2023-11-29: qty 30

## 2023-11-29 MED ORDER — LACTATED RINGERS IV SOLN
500.0000 mL | INTRAVENOUS | Status: DC | PRN
  Administered 2023-12-01: 500 mL via INTRAVENOUS

## 2023-11-29 MED ORDER — OXYTOCIN-SODIUM CHLORIDE 30-0.9 UT/500ML-% IV SOLN
1.0000 m[IU]/min | INTRAVENOUS | Status: DC
  Administered 2023-11-30: 2 m[IU]/min via INTRAVENOUS

## 2023-11-29 MED ORDER — SODIUM CHLORIDE 0.9 % IV SOLN
5.0000 10*6.[IU] | Freq: Once | INTRAVENOUS | Status: AC
  Administered 2023-11-29: 5 10*6.[IU] via INTRAVENOUS
  Filled 2023-11-29: qty 5

## 2023-11-29 MED ORDER — OXYTOCIN-SODIUM CHLORIDE 30-0.9 UT/500ML-% IV SOLN: 2.5000 [IU]/h | INTRAVENOUS | Status: DC

## 2023-11-29 MED ORDER — LABETALOL HCL 5 MG/ML IV SOLN: 80.0000 mg | INTRAVENOUS | Status: DC | PRN

## 2023-11-29 MED ORDER — LABETALOL HCL 5 MG/ML IV SOLN: 20.0000 mg | INTRAVENOUS | Status: DC | PRN

## 2023-11-29 MED ORDER — FENTANYL-BUPIVACAINE-NACL 0.5-0.125-0.9 MG/250ML-% EP SOLN
12.0000 mL/h | EPIDURAL | Status: DC | PRN
  Administered 2023-12-01: 12 mL/h via EPIDURAL
  Filled 2023-11-29: qty 250

## 2023-11-29 MED ORDER — DIPHENHYDRAMINE HCL 50 MG/ML IJ SOLN: 12.5000 mg | INTRAMUSCULAR | Status: DC | PRN

## 2023-11-29 MED ORDER — FAMOTIDINE IN NACL 20-0.9 MG/50ML-% IV SOLN
20.0000 mg | Freq: Once | INTRAVENOUS | Status: AC
  Administered 2023-11-29: 20 mg via INTRAVENOUS
  Filled 2023-11-29: qty 50

## 2023-11-29 MED ORDER — MISOPROSTOL 50MCG HALF TABLET
50.0000 ug | ORAL_TABLET | ORAL | Status: DC
  Administered 2023-11-29 – 2023-11-30 (×4): 50 ug via BUCCAL
  Filled 2023-11-29 (×4): qty 1

## 2023-11-29 NOTE — Consult Note (Signed)
 MFM Consult Note  Margaret Harvey is currently at 32 weeks and 2 days.  She was readmitted last night due to worsening blood pressures probably due to severe preeclampsia.    She received a complete course of antenatal corticosteroids during her prior admission.  The patient is currently treated with labetalol  800 mg 3 times a day and amlodipine  10 mg daily.  Her most recent growth scan performed about 2 weeks ago showed an EFW of 2 pounds 14 ounces (13th percentile).  She had a BPP performed this afternoon that was 6 out of 8.  The fetus received a -2 for fetal breathing movements that did not meet criteria.    There was normal amniotic fluid noted with a total AFI of 10 cm.  The fetus was in the vertex presentation on today's exam.  I was asked by Dr. Erik to review her fetal heart rate tracing which has been nonreactive without any 15 x 15 accelerations for the past 3 hours with minimal variability.  Should her fetal heart rate tracing continue to be nonreactive in another hour or two, delivery may be considered due to nonreassuring fetal status in light of her diagnosis of preeclampsia.  As the fetus is in the vertex presentation and no decelerations were noted on her fetal heart rate tracing, a vaginal delivery may be attempted should she require delivery.

## 2023-11-29 NOTE — Progress Notes (Signed)
 Labor Progress Note  Margaret Harvey is a 23 y.o. G2P0010 at [redacted]w[redacted]d presented for IOL for pre-E with SF  S: Pt feeling well overall no HA at this time  O:  BP (!) 153/89   Pulse (!) 109   Temp 98.3 F (36.8 C) (Oral)   Resp 17   Ht 5' 2 (1.575 m)   Wt 87.1 kg   LMP 03/10/2023   SpO2 100%   BMI 35.12 kg/m  EFM:140 bpm/Mild variability/ none accels/ None decels CAT: 2 Toco: none   CVE: Dilation: Closed Effacement (%): Thick Station: Ballotable Exam by:: Dr. Von   A&P: 23 y.o. G2P0010 [redacted]w[redacted]d  here for  as above sent up from ante to be induced for a continued non reactive NST with decels.   #Labor: Originally there was discussion of IV pit instead of cytotec  with her strip but now looking improved so will give cytotec  oral and reassess in 4 hours to see if FB can be placed #Pain: epidural and IV pain meds on request #FWB: CAT 2 #GBS unknown on PCN, has gotten abx and swab not done on admission but abx have been going so will not order swab now so it would be inaccurate  #pre-E with SF: amlodipine  10mg , labetalol  800mg  q8 hours, mag, and prn  hydral for severe ranges. Bps still elevated but stable. PEC labs nml  #Asthma: prn albuterol    Margaret Clover, MD PGY-2 [facility] Family Medicine Resident [facility], Center for [facility] 11/29/23  9:48 PM

## 2023-11-29 NOTE — Progress Notes (Signed)
 Labor Progress Note Margaret Harvey is a 23 y.o. G2P0010 at [redacted]w[redacted]d presented for pre-eclampsia with severe features, admitted to L&D for IOL due to decompensation of fetal status  S: In to meet patient, no questions at present. We discussed IOL and reason for IOL at this time in detail.  O:  BP (!) 138/99   Pulse (!) 108   Temp 98.3 F (36.8 C) (Oral)   Resp 20   Ht 5' 2 (1.575 m)   Wt 87.1 kg   LMP 03/10/2023   SpO2 100%   BMI 35.12 kg/m  EFM: 150/min/no accels/no decels  CVE: Dilation: Closed Effacement (%): Thick Station: Ballotable Exam by:: Dr. Von   A&P: 23 y.o. G2P0010 [redacted]w[redacted]d here for IOL in setting of cat II tracing with known pre-eclampsia w SF  #Labor: Cx closed and thick. Discussed with her that at this time, I would recommend proceeding w Pitocin  1x1 given cat II tracing and ultimately IOL for nonreassuring fetal status. Pt open to this. Will start after she has a meal. #Pain: Per pt request #FWB: Cat II, mod variability #GBS unknown, on PCN  #PEC w SF: BP mild to mod range  PEC labs wnl  cont to watch BP closely  #GAD/MDD: Early PPD screening  #IOL at [redacted]w[redacted]d: Seen by NICU  Alain Von, MD 6:55 PM

## 2023-11-29 NOTE — Progress Notes (Addendum)
 FACULTY PRACTICE ANTEPARTUM COMPREHENSIVE PROGRESS NOTE  Margaret Harvey is a 23 y.o. G2P0010 at [redacted]w[redacted]d who is admitted for preeclampsia with severe features (BP).  Estimated Date of Delivery: 01/22/24 Fetal presentation is cephalic (7/16).  Length of Stay:  1 Days. Admitted 11/28/2023  Subjective: Doing well this morning. Still having intermittent headaches but nothing currently. Denies CP/SOB/RUQ/epigastric pain. No ctx, VB, LOF. +FM  Vitals:  Blood pressure 137/82, pulse 100, temperature 98.1 F (36.7 C), temperature source Oral, resp. rate 18, height 5' 2 (1.575 m), weight 87.1 kg, last menstrual period 03/10/2023, SpO2 99%. Physical Examination: CONSTITUTIONAL: Well-developed, well-nourished female in no acute distress.  CARDIOVASCULAR: Normal heart rate noted RESPIRATORY: Effort normal, no problems with respiration noted MUSCULOSKELETAL: Normal range of motion. No edema and no tenderness. 2+ distal pulses. ABDOMEN: Soft, nontender, nondistended, gravid.  Fetal monitoring: FHR: 150 bpm, Variability: moderate with periods of minimal variability, Accelerations: absent, Decelerations: Absent  -- will continue to monitor Uterine activity: flat  Results for orders placed or performed during the hospital encounter of 11/28/23 (from the past 48 hours)  Protein / creatinine ratio, urine     Status: Abnormal   Collection Time: 11/28/23  2:13 PM  Result Value Ref Range   Creatinine, Urine 45 mg/dL   Total Protein, Urine 7 mg/dL    Comment: NO NORMAL RANGE ESTABLISHED FOR THIS TEST   Protein Creatinine Ratio 0.16 (H) 0.00 - 0.15 mg/mg[Cre]    Comment: Performed at [facility], [address], [place], [place] [address]  CBC     Status: Abnormal   Collection Time: 11/28/23  3:17 PM  Result Value Ref Range   WBC 12.1 (H) 4.0 - 10.5 K/uL   RBC 3.82 (L) 3.87 - 5.11 MIL/uL   Hemoglobin 10.4 (L) 12.0 - 15.0 g/dL   HCT 68.1 (L) 63.9 - 53.9 %   MCV 83.2 80.0 - 100.0 fL   MCH 27.2 26.0 -  34.0 pg   MCHC 32.7 30.0 - 36.0 g/dL   RDW 85.9 88.4 - 84.4 %   Platelets 310 150 - 400 K/uL   nRBC 0.0 0.0 - 0.2 %    Comment: Performed at [facility], [address], [place], [place] [address]  Comprehensive metabolic panel with GFR     Status: Abnormal   Collection Time: 11/28/23  3:17 PM  Result Value Ref Range   Sodium 136 135 - 145 mmol/L   Potassium 3.4 (L) 3.5 - 5.1 mmol/L   Chloride 102 98 - 111 mmol/L   CO2 24 22 - 32 mmol/L   Glucose, Bld 94 70 - 99 mg/dL    Comment: Glucose reference range applies only to samples taken after fasting for at least 8 hours.   BUN 6 6 - 20 mg/dL   Creatinine, Ser 9.53 0.44 - 1.00 mg/dL   Calcium  9.2 8.9 - 10.3 mg/dL   Total Protein 6.9 6.5 - 8.1 g/dL   Albumin 3.1 (L) 3.5 - 5.0 g/dL   AST 16 15 - 41 U/L   ALT 14 0 - 44 U/L   Alkaline Phosphatase 213 (H) 38 - 126 U/L   Total Bilirubin 0.4 0.0 - 1.2 mg/dL   GFR, Estimated >39 >39 mL/min    Comment: (NOTE) Calculated using the CKD-EPI Creatinine Equation (2021)    Anion gap 10 5 - 15    Comment: Performed at [facility], [address] [place], [place] [address]  Type and screen [facility]     Status: None   Collection Time: 11/28/23  7:41 PM  Result Value Ref Range   ABO/RH(D) O POS    Antibody Screen NEG    Sample Expiration      12/01/2023,2359 Performed at [facility], [address], [place], [place] [address]     US  MFM FETAL BPP WO NON STRESS Result Date: 11/29/2023 ----------------------------------------------------------------------  OBSTETRICS REPORT                        (Signed Final 11/29/2023 09:19 am) ---------------------------------------------------------------------- Patient Info  ID #:       [id number]                          D.O.B.:  [birth year] (22 yrs)(F)  Name:       Margaret Harvey                   Visit Date: 11/28/2023 06:32 pm ---------------------------------------------------------------------- Performed By  Attending:         Steffan Keys MD         Secondary Phy.:    [facility]                                                              Care  Performed By:     Alyssa Keatts          Tertiary Phy.:     [facility] MAU/Triage                    RDMS  Referred By:      RANEE [place]       [facility]  Ref. Address:     [address] [place], [place] ---------------------------------------------------------------------- Orders  #  Description                           Code        Ordered By  1  US  MFM FETAL BPP WO NON               23180.98    ROLITTA DAWSON     STRESS ----------------------------------------------------------------------  #  Order #                     Accession #                Episode #  1  [id number]                   [id number]  [id number] ---------------------------------------------------------------------- Indications  Non-reactive NST                                O28.9  Gestational hypertension without significant    O13.3  proteinuria, third trimester (Labetalol )  Herpes simplex virus (HSV)                      O98.519 B00.9  Echogenic intracardiac focus of the heart       O35.8XX0  (EIF)  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)     Z14.8  LR NIPS - Female, Negative AFP, GTT WNL  [redacted] weeks gestation of pregnancy                 Z3A.32 ---------------------------------------------------------------------- Fetal Evaluation  Num Of Fetuses:          1  Fetal Heart Rate(bpm):   137  Cardiac Activity:        Observed  Presentation:            Cephalic  Placenta:                Posterior  Amniotic Fluid  AFI FV:      Within normal limits  AFI Sum(cm)     %Tile       Largest Pocket(cm)  14.3            49          7.1  RUQ(cm)                     LUQ(cm)        LLQ(cm)  7.1                         5.8            1.4  Comment:    Stomach, bladder, diaphragm noted.  ---------------------------------------------------------------------- Biophysical Evaluation  Amniotic F.V:   Within normal limits       F. Tone:         Observed  F. Movement:    Observed                   Score:           8/8  F. Breathing:   Observed ---------------------------------------------------------------------- OB History  Gravidity:    2         Term:   0        Prem:   0        SAB:   0  TOP:          1       Ectopic:  0        Living: 0 ---------------------------------------------------------------------- Gestational Age  LMP:           37w 4d        Date:  03/10/23                 EDD:   12/15/23  Best:          32w 1d     Det. By:  U/S C R L  (05/29/23)    EDD:   01/22/24 ---------------------------------------------------------------------- Cervix Uterus Adnexa  Cervix  Not visualized (advanced GA >24wks)  Uterus  No abnormality visualized.  Right Ovary  Not visualized.  Left Ovary  Not visualized.  Adnexa  No abnormality  visualized ---------------------------------------------------------------------- Comments  This patient was admitted due to elevated blood pressures  and possible preeclampsia.  A biophysical profile performed today was 8/8.  There was normal amniotic fluid noted with a total AFI of 14.3  cm.  The fetus was in the vertex presentation. ----------------------------------------------------------------------                   Steffan Keys, MD Electronically Signed Final Report   11/29/2023 09:19 am ----------------------------------------------------------------------    Current scheduled medications  amLODipine   10 mg Oral Daily   aspirin  EC  81 mg Oral Daily   budesonide   0.25 mg Nebulization BID   docusate sodium   100 mg Oral Daily   enoxaparin  (LOVENOX ) injection  40 mg Subcutaneous Q24H   escitalopram   10 mg Oral QPM   fluticasone   2 spray Each Nare Daily   labetalol   800 mg Oral Q8H   loratadine   10 mg Oral QPM   prenatal multivitamin  1 tablet Oral Q1200   sodium  chloride flush  3-10 mL Intravenous Q12H   valACYclovir   500 mg Oral BID    I have reviewed the patient's current medications.  ASSESSMENT: Active Problems:   Herpes simplex vulvovaginitis   Severe preeclampsia   PLAN: Preeclampsia w/ SF (BP) - pt has required relatively rapid titration of her BP meds in the past 10 days (labetalol  400 TID > labetalol  800 TID & amlodipine  10mg ) with no history of cHTN. She is at maximum doses of 2 agents, so I suspect this is preeclampsia with severe features by blood pressures - BP are currently mild range on labetalol  800 TID and amlodipine  10mg  - asymptomatic, but if develops neuro symptoms or severe range BP plan for magnesium   - labs normal, continue q72h - delivery at 34 weeks (7/29 scheduled) or sooner pending BP control   2. FWB - dNST - current nonreactive, will continue to monitor & obtain BPP as needed - EFW 13% 1311g at [redacted]w[redacted]d - next growth 7/22 - s/p BMZ 7/7-8 - NICU consult today  3. HSV - valtrex  suppression  4. Anxiety - continue home lexapro   PPX: lovenox , SCDs Continue routine antenatal care.  Kieth Carolin, MD Obstetrician & Gynecologist, [employer] for [employer], [facility]

## 2023-11-29 NOTE — Consult Note (Signed)
 [facility]  Prenatal Consult      11/29/2023   3:13 PM  I was asked by Dr. Kieth Carolin to consult on this patient for possible preterm delivery. I had the pleasure of meeting with Michille Mcelrath and her spouse, Nancyann, today.   She is a 23 yo G46P0010 female presenting at [redacted]w[redacted]d IUP with concerns for preeclampsia with severe features and nonreassuring fetal status (6/8 BPP and minimal variability on tracing). 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 She has received BMZ x2 (last dose 7/8). Most recent estimated fetal weight of 1311g grams on 6/30. They have chosen to name their baby Annaleigh.   We discussed the possible needs for an infant born at this gestation. I explained that the neonatal intensive care team would be present for the delivery and outlined the likely delivery room course for this baby including routine resuscitation and NRP-guided approaches to the treatment of respiratory distress. We discussed the potential need for CPAP or surfactant administration for respiratory distress, IV fluids pending establishment of enteral feeds, temperature support, and monitoring.   We discussed other common problems associated with prematurity including respiratory distress syndrome, feeding issues, and infection risk.   We discussed the importance of good nutrition and various methods of providing nutrition (parenteral hyperalimentation, gavage feedings and/or oral feeding). We discussed the benefits of human milk. I encouraged breast feeding and pumping soon after birth and outlined resources that are available to support breast feeding. We discussed the possibility of using donor breast milk as a bridge.  We discussed the average length of stay but I noted that the actual LOS would depend on the severity of  problems encountered and response to treatments. We discussed visitation policies and the resources available while their child is in the hospital.  They expressed understanding and agreement with the plan for resuscitation and intensive care. All questions were answered.  Thank you for involving us  in the care of this patient. A member of our team will be available should the family have additional questions.   Tiyona Desouza, MD Neonatal Medicine  I spent ~40 minutes in consultation time, of which 25 minutes was spent in direct face to face counseling.

## 2023-11-29 NOTE — Progress Notes (Signed)
 Progress Note  In to see patient. Discussed that we re-evaulated her tracing. She continues to have a non reactive NST with baseline 150bpm, moderate variability, no accels, and now occasional bowl shaped decels.   Given decompensation of fetal status, we will transfer the patient to labor and delivery for induction of labor. Reviewed that there are enough reassuring features to the tracing that an attempt at vaginal delivery is currently reasonable. She is in agreement with the plan. NICU at bedside counseling patient.   Plan for magnesium  for seizure ppx given preE w/ SF (BP), induction method TBD on admission to LDR.   Kieth Carolin, MD Obstetrician & Gynecologist, [facility] for [employer], [facility]

## 2023-11-30 ENCOUNTER — Ambulatory Visit

## 2023-11-30 LAB — COMPREHENSIVE METABOLIC PANEL WITH GFR
ALT: 14 U/L (ref 0–44)
AST: 17 U/L (ref 15–41)
Albumin: 3.2 g/dL — ABNORMAL LOW (ref 3.5–5.0)
Alkaline Phosphatase: 209 U/L — ABNORMAL HIGH (ref 38–126)
Anion gap: 10 (ref 5–15)
BUN: 5 mg/dL — ABNORMAL LOW (ref 6–20)
CO2: 21 mmol/L — ABNORMAL LOW (ref 22–32)
Calcium: 7.5 mg/dL — ABNORMAL LOW (ref 8.9–10.3)
Chloride: 100 mmol/L (ref 98–111)
Creatinine, Ser: 0.34 mg/dL — ABNORMAL LOW (ref 0.44–1.00)
GFR, Estimated: >60 mL/min (ref >60)
Glucose, Bld: 134 mg/dL — ABNORMAL HIGH (ref 70–99)
Potassium: 3.5 mmol/L (ref 3.5–5.1)
Sodium: 131 mmol/L — ABNORMAL LOW (ref 135–145)
Total Bilirubin: 0.4 mg/dL (ref 0.0–1.2)
Total Protein: 7.2 g/dL (ref 6.5–8.1)

## 2023-11-30 LAB — CBC
HCT: 32.7 % — ABNORMAL LOW (ref 36.0–46.0)
HCT: 35.9 % — ABNORMAL LOW (ref 36.0–46.0)
Hemoglobin: 10.8 g/dL — ABNORMAL LOW (ref 12.0–15.0)
Hemoglobin: 11.7 g/dL — ABNORMAL LOW (ref 12.0–15.0)
MCH: 27.6 pg (ref 26.0–34.0)
MCH: 27.3 pg (ref 26.0–34.0)
MCHC: 33 g/dL (ref 30.0–36.0)
MCHC: 32.6 g/dL (ref 30.0–36.0)
MCV: 83.6 fL (ref 80.0–100.0)
MCV: 83.9 fL (ref 80.0–100.0)
Platelets: 328 K/uL (ref 150–400)
Platelets: 335 K/uL (ref 150–400)
RBC: 3.91 MIL/uL (ref 3.87–5.11)
RBC: 4.28 MIL/uL (ref 3.87–5.11)
RDW: 14 % (ref 11.5–15.5)
RDW: 14.3 % (ref 11.5–15.5)
WBC: 12 K/uL — ABNORMAL HIGH (ref 4.0–10.5)
WBC: 15.2 K/uL — ABNORMAL HIGH (ref 4.0–10.5)
nRBC: 0 % (ref 0.0–0.2)
nRBC: 0 % (ref 0.0–0.2)

## 2023-11-30 LAB — GROUP B STREP BY PCR: Group B strep by PCR: NEGATIVE

## 2023-11-30 MED ORDER — TERBUTALINE SULFATE 1 MG/ML IJ SOLN
0.2500 mg | Freq: Once | INTRAMUSCULAR | Status: AC | PRN
  Administered 2023-12-01: 0.25 mg via SUBCUTANEOUS

## 2023-11-30 MED ORDER — PROMETHAZINE (PHENERGAN) 6.25MG IN NS 50ML IVPB
6.2500 mg | Freq: Four times a day (QID) | INTRAVENOUS | Status: DC | PRN
  Administered 2023-11-30 (×3): 6.25 mg via INTRAVENOUS
  Filled 2023-11-30 (×2): qty 6.25
  Filled 2023-11-30 (×2): qty 50

## 2023-11-30 MED ORDER — OXYTOCIN-SODIUM CHLORIDE 30-0.9 UT/500ML-% IV SOLN
1.0000 m[IU]/min | INTRAVENOUS | Status: DC
  Filled 2023-11-30: qty 500

## 2023-11-30 MED ORDER — LACTATED RINGERS IV SOLN: INTRAVENOUS | Status: DC

## 2023-11-30 MED ORDER — CYCLOBENZAPRINE HCL 5 MG PO TABS
10.0000 mg | ORAL_TABLET | Freq: Three times a day (TID) | ORAL | Status: DC | PRN
  Administered 2023-11-30: 10 mg via ORAL
  Filled 2023-11-30: qty 2

## 2023-11-30 MED ORDER — PROMETHAZINE HCL 12.5 MG PO TABS: 12.5000 mg | ORAL_TABLET | Freq: Four times a day (QID) | ORAL | Status: DC | PRN

## 2023-11-30 NOTE — Progress Notes (Signed)
 Labor Progress Note  In to see patient. She is doing well, feeling comfortable.  SCE 3-4/40/-3. AROM clear FHT 130bpm, moderate variability with periods of minimal variability (likely mag related), no accels, no decels  Will start pitocin  2x2. Continue IOL  Kieth Carolin, MD Obstetrician & Gynecologist, [facility] for [facility], [facility]

## 2023-11-30 NOTE — Progress Notes (Signed)
 Patient ID: Katilynn Sinkler, female   DOB: 2000/11/27, 23 y.o.   MRN: [id number]  Vitals:   11/30/23 1804 11/30/23 1831  BP: (!) 143/92 137/85  Pulse: (!) 103 (!) 104  Resp:    Temp:    SpO2:     Dilation: 3.5 Effacement (%): 40 Station: -3 Presentation: Vertex Exam by:: Leeroy Dustman RN  Resting in bed. BP remains mildly elevated; stable on labetalol  800 mg TID, Norvasc  10 mg daily, and magnesium . UC irregular. Will continue to titrate Pitocin . FHR Cat 1-2; periods of minimal variability present, appropriate for magnesium  infusion.  Vernell Ruddle, [facility] 11/30/23 2028

## 2023-11-30 NOTE — Progress Notes (Signed)
 Patient ID: Margaret Harvey, female   DOB: 05-13-2001, 23 y.o.   MRN: [id number]  Patient Vitals for the past 4 hrs:  BP Temp Temp src Pulse Resp  11/30/23 1203 (!) 143/88 -- -- 90 --  11/30/23 1101 137/89 -- -- 91 --  11/30/23 1100 -- -- -- -- 15  11/30/23 1001 (!) 138/95 (!) 97.1 F (36.2 C) Axillary 89 --  11/30/23 1000 -- -- -- -- 14  11/30/23 0901 130/82 -- -- 88 --     IOL for pre-e w/ SF progressing well. BP elevated mild range; stable on labetalol  800 mg TID, Norvasc  10 mg daily, and magnesium . Foley balloon recently expelled. Cytotec  given 1001 (4th dose). IV Fentanyl  giving good pain relief. Hoping to labor without epidural. GBS unknown--4 doses of penicillin  given so far. FHR Cat 1. Plan to recheck SVE around 1400.  Vernell Ruddle, [facility] 11/30/23 1310

## 2023-11-30 NOTE — Progress Notes (Signed)
 Labor Progress Note  Margaret Harvey is a 23 y.o. G2P0010 at [redacted]w[redacted]d presented for IOL for pre-E with SF  S: Doing fine, minimal cramping with FB  O:  BP (!) 148/90   Pulse (!) 105   Temp 98.3 F (36.8 C) (Oral)   Resp 17   Ht 5' 2 (1.575 m)   Wt 87.1 kg   LMP 03/10/2023   SpO2 100%   BMI 35.12 kg/m  EFM:130 bpm/Mild variability/ none accels/ None decels CAT: 2 Toco: none   CVE: 0.5/0/-3   A&P: 23 y.o. G2P0010 [redacted]w[redacted]d  here for  as above sent up from ante to be induced for a continued non reactive NST with decels.   #Labor: FB still in place with continue with buccal cyto until out. Strip with minimal variability but no decels will continue with induction.    #Pain: epidural and IV pain meds on request #FWB: CAT 2 #GBS unknown on PCN, has gotten abx and swab not done on admission but abx have been going so will not order swab now so it would be inaccurate  #pre-E with SF: amlodipine  10mg , labetalol  800mg  q8 hours, mag, and prn  hydral for severe ranges. Bps still elevated but stable. PEC labs nml  #Asthma: prn albuterol    Chiquita Clover, MD PGY-2 [facility] Medicine Resident [facility], Center for [facility] 11/30/23  4:47 AM

## 2023-11-30 NOTE — Progress Notes (Signed)
 LABOR PROGRESS NOTE  Patient Name: Margaret Harvey, female   DOB: 2001-01-28, 23 y.o.  MRN: [id number]  Called to the room for difficulty monitoring contractions. Pt sitting up on the ball. Coping well with contractions. Discussed roles, risks, benefits of IUPC to facilitate pitocin  titration; pt agreeable.   Blood pressure (!) 134/94, pulse 98, temperature 97.7 F (36.5 C), temperature source Oral, resp. rate 16, height 5' 2 (1.575 m), weight 87.1 kg, last menstrual period 03/10/2023, SpO2 100%.  Dilation: 4 Effacement (%): 50 Station: -3 Presentation: Vertex Exam by:: Randall Adcox RN  EFM: baseline 135, accels, no decels, moderate variability TOCO: q1-41min contractions  IUPC placed at 2 o'clock position due to posterior placenta. Continue to titrate pitocin  to adequate, as needed; appears adequate.  Mild range BP, asymptomatic Continue Mg  Mardy Shropshire, MD

## 2023-11-30 NOTE — Progress Notes (Signed)
 LABOR PROGRESS NOTE  Patient Name: Margaret Harvey, female   DOB: Aug 11, 2000, 23 y.o.  MRN: [id number]  Pt comfortable.   Blood pressure (!) 134/94, pulse 98, temperature 97.7 F (36.5 C), temperature source Oral, resp. rate 16, height 5' 2 (1.575 m), weight 87.1 kg, last menstrual period 03/10/2023, SpO2 100%.  Dilation: 4 Effacement (%): 50 Station: -3 Presentation: Vertex Exam by:: Randall Adcox RN  Continue to titrate Pitocin . Reassess in 4-6 hours.   Mardy Shropshire, MD

## 2023-11-30 NOTE — Progress Notes (Signed)
 Labor Progress Note  Margaret Harvey is a 23 y.o. G2P0010 at [redacted]w[redacted]d presented for IOL for pre-E with SF  S: Doing well wanting to try the FB  O:  BP 138/87   Pulse (!) 107   Temp 98.3 F (36.8 C) (Oral)   Resp 17   Ht 5' 2 (1.575 m)   Wt 87.1 kg   LMP 03/10/2023   SpO2 100%   BMI 35.12 kg/m  EFM:150 bpm/Mild variability/ none accels/ None decels CAT: 2 Toco: none   CVE: 0.5/0/-3   A&P: 23 y.o. G2P0010 [redacted]w[redacted]d  here for  as above sent up from ante to be induced for a continued non reactive NST with decels.   #Labor: FB placed with spec 40cc placed. Will also give another dose of cytotec  orally. Stip with minimal variability but no decels and does have more moderate variability at times, overall reassuring.  #Pain: epidural and IV pain meds on request #FWB: CAT 2 #GBS unknown on PCN, has gotten abx and swab not done on admission but abx have been going so will not order swab now so it would be inaccurate  #pre-E with SF: amlodipine  10mg , labetalol  800mg  q8 hours, mag, and prn  hydral for severe ranges. Bps still elevated but stable. PEC labs nml  #Asthma: prn albuterol    Chiquita Clover, MD PGY-2 [facility] Family Medicine Resident [facility], Center for [facility] 11/30/23  1:45 AM

## 2023-12-01 ENCOUNTER — Encounter (HOSPITAL_COMMUNITY): Payer: Self-pay | Admitting: Obstetrics & Gynecology

## 2023-12-01 ENCOUNTER — Encounter (HOSPITAL_COMMUNITY)
Admission: AD | Disposition: A | Discharge status: Home / Self Care | Payer: Self-pay | Source: Home / Self Care | Attending: Obstetrics and Gynecology

## 2023-12-01 ENCOUNTER — Inpatient Hospital Stay (HOSPITAL_COMMUNITY): Admitting: Anesthesiology

## 2023-12-01 ENCOUNTER — Other Ambulatory Visit: Payer: Self-pay

## 2023-12-01 DIAGNOSIS — Z3A32 32 weeks gestation of pregnancy: Secondary | ICD-10-CM

## 2023-12-01 DIAGNOSIS — Z98891 History of uterine scar from previous surgery: Secondary | ICD-10-CM

## 2023-12-01 DIAGNOSIS — O99344 Other mental disorders complicating childbirth: Secondary | ICD-10-CM

## 2023-12-01 DIAGNOSIS — O1413 Severe pre-eclampsia, third trimester: Secondary | ICD-10-CM

## 2023-12-01 DIAGNOSIS — O1414 Severe pre-eclampsia complicating childbirth: Secondary | ICD-10-CM

## 2023-12-01 LAB — CBC
HCT: 33.4 % — ABNORMAL LOW (ref 36.0–46.0)
Hemoglobin: 10.8 g/dL — ABNORMAL LOW (ref 12.0–15.0)
MCH: 27.1 pg (ref 26.0–34.0)
MCHC: 32.3 g/dL (ref 30.0–36.0)
MCV: 83.7 fL (ref 80.0–100.0)
Platelets: 309 K/uL (ref 150–400)
RBC: 3.99 MIL/uL (ref 3.87–5.11)
RDW: 14.1 % (ref 11.5–15.5)
WBC: 18.2 K/uL — ABNORMAL HIGH (ref 4.0–10.5)
nRBC: 0 % (ref 0.0–0.2)

## 2023-12-01 LAB — CBC WITH DIFFERENTIAL/PLATELET
Abs Immature Granulocytes: 0.15 K/uL — ABNORMAL HIGH (ref 0.00–0.07)
Basophils Absolute: 0 K/uL (ref 0.0–0.1)
Basophils Relative: 0 %
Eosinophils Absolute: 0.2 K/uL (ref 0.0–0.5)
Eosinophils Relative: 1 %
HCT: 34.9 % — ABNORMAL LOW (ref 36.0–46.0)
Hemoglobin: 11.6 g/dL — ABNORMAL LOW (ref 12.0–15.0)
Immature Granulocytes: 1 %
Lymphocytes Relative: 15 %
Lymphs Abs: 2.5 K/uL (ref 0.7–4.0)
MCH: 27.6 pg (ref 26.0–34.0)
MCHC: 33.2 g/dL (ref 30.0–36.0)
MCV: 83.1 fL (ref 80.0–100.0)
Monocytes Absolute: 1.4 K/uL — ABNORMAL HIGH (ref 0.1–1.0)
Monocytes Relative: 9 %
Neutro Abs: 11.7 K/uL — ABNORMAL HIGH (ref 1.7–7.7)
Neutrophils Relative %: 74 %
Platelets: 318 K/uL (ref 150–400)
RBC: 4.2 MIL/uL (ref 3.87–5.11)
RDW: 14.1 % (ref 11.5–15.5)
WBC: 15.9 K/uL — ABNORMAL HIGH (ref 4.0–10.5)
nRBC: 0 % (ref 0.0–0.2)

## 2023-12-01 SURGERY — Surgical Case
Anesthesia: Epidural

## 2023-12-01 MED ORDER — ONDANSETRON HCL 4 MG/2ML IJ SOLN
4.0000 mg | Freq: Three times a day (TID) | INTRAMUSCULAR | Status: DC | PRN
  Administered 2023-12-01 (×2): 4 mg via INTRAVENOUS
  Filled 2023-12-01 (×2): qty 2

## 2023-12-01 MED ORDER — SODIUM CHLORIDE 0.9 % IR SOLN
Status: DC | PRN
  Administered 2023-12-01: 1

## 2023-12-01 MED ORDER — SIMETHICONE 80 MG PO CHEW
80.0000 mg | CHEWABLE_TABLET | ORAL | Status: DC | PRN
  Administered 2023-12-01 – 2023-12-03 (×2): 80 mg via ORAL
  Filled 2023-12-01 (×2): qty 1

## 2023-12-01 MED ORDER — ACETAMINOPHEN 500 MG PO TABS
1000.0000 mg | ORAL_TABLET | Freq: Four times a day (QID) | ORAL | Status: DC
  Administered 2023-12-01 – 2023-12-04 (×11): 1000 mg via ORAL
  Filled 2023-12-01 (×11): qty 2

## 2023-12-01 MED ORDER — OXYTOCIN-SODIUM CHLORIDE 30-0.9 UT/500ML-% IV SOLN
INTRAVENOUS | Status: DC | PRN
  Administered 2023-12-01: 300 mL via INTRAVENOUS

## 2023-12-01 MED ORDER — IBUPROFEN 600 MG PO TABS
600.0000 mg | ORAL_TABLET | Freq: Four times a day (QID) | ORAL | Status: DC
  Administered 2023-12-02 – 2023-12-04 (×8): 600 mg via ORAL
  Filled 2023-12-01 (×9): qty 1

## 2023-12-01 MED ORDER — WITCH HAZEL-GLYCERIN EX PADS: 1.0000 | MEDICATED_PAD | CUTANEOUS | Status: DC | PRN

## 2023-12-01 MED ORDER — HYDROMORPHONE HCL 1 MG/ML IJ SOLN
INTRAMUSCULAR | Status: AC
Start: 2023-12-01 — End: 2023-12-01
  Filled 2023-12-01: qty 0.5

## 2023-12-01 MED ORDER — LACTATED RINGERS IV SOLN: INTRAVENOUS | Status: DC | PRN

## 2023-12-01 MED ORDER — KETOROLAC TROMETHAMINE 30 MG/ML IJ SOLN
INTRAMUSCULAR | Status: AC
  Filled 2023-12-01: qty 1

## 2023-12-01 MED ORDER — MORPHINE SULFATE (PF) 0.5 MG/ML IJ SOLN
INTRAMUSCULAR | Status: DC | PRN
  Administered 2023-12-01: 3 mg via EPIDURAL

## 2023-12-01 MED ORDER — PRENATAL MULTIVITAMIN CH
1.0000 | ORAL_TABLET | Freq: Every day | ORAL | Status: DC
  Administered 2023-12-01 – 2023-12-03 (×3): 1 via ORAL
  Filled 2023-12-01 (×3): qty 1

## 2023-12-01 MED ORDER — HYDROMORPHONE HCL 2 MG PO TABS: 2.0000 mg | ORAL_TABLET | ORAL | Status: DC | PRN

## 2023-12-01 MED ORDER — MORPHINE SULFATE (PF) 0.5 MG/ML IJ SOLN
INTRAMUSCULAR | Status: AC
Start: 2023-12-01 — End: 2023-12-01
  Filled 2023-12-01: qty 10

## 2023-12-01 MED ORDER — KETOROLAC TROMETHAMINE 30 MG/ML IJ SOLN
30.0000 mg | Freq: Four times a day (QID) | INTRAMUSCULAR | Status: AC
  Administered 2023-12-01 – 2023-12-02 (×4): 30 mg via INTRAVENOUS
  Filled 2023-12-01 (×4): qty 1

## 2023-12-01 MED ORDER — MAGNESIUM SULFATE 40 GM/1000ML IV SOLN
INTRAVENOUS | Status: AC
  Filled 2023-12-01: qty 1000

## 2023-12-01 MED ORDER — NALOXONE HCL 0.4 MG/ML IJ SOLN
0.4000 mg | INTRAMUSCULAR | Status: DC | PRN
Start: 2023-12-01 — End: 2023-12-04

## 2023-12-01 MED ORDER — ENOXAPARIN SODIUM 40 MG/0.4ML IJ SOSY
40.0000 mg | PREFILLED_SYRINGE | INTRAMUSCULAR | Status: DC
  Administered 2023-12-02 – 2023-12-04 (×3): 40 mg via SUBCUTANEOUS
  Filled 2023-12-01 (×3): qty 0.4

## 2023-12-01 MED ORDER — DIBUCAINE (PERIANAL) 1 % EX OINT
1.0000 | TOPICAL_OINTMENT | CUTANEOUS | Status: DC | PRN
Start: 2023-12-01 — End: 2023-12-04

## 2023-12-01 MED ORDER — SCOPOLAMINE 1 MG/3DAYS TD PT72
MEDICATED_PATCH | TRANSDERMAL | Status: AC
  Filled 2023-12-01: qty 1

## 2023-12-01 MED ORDER — LACTATED RINGERS AMNIOINFUSION: INTRAVENOUS | Status: DC

## 2023-12-01 MED ORDER — DEXAMETHASONE SODIUM PHOSPHATE 10 MG/ML IJ SOLN
INTRAMUSCULAR | Status: DC | PRN
  Administered 2023-12-01: 10 mg via INTRAVENOUS

## 2023-12-01 MED ORDER — ACETAMINOPHEN 10 MG/ML IV SOLN
INTRAVENOUS | Status: DC | PRN
  Administered 2023-12-01: 1000 mg via INTRAVENOUS

## 2023-12-01 MED ORDER — ONDANSETRON HCL 4 MG/2ML IJ SOLN
INTRAMUSCULAR | Status: DC | PRN
  Administered 2023-12-01: 4 mg via INTRAVENOUS

## 2023-12-01 MED ORDER — CEFAZOLIN SODIUM-DEXTROSE 2-3 GM-%(50ML) IV SOLR
INTRAVENOUS | Status: DC | PRN
  Administered 2023-12-01: 2 g via INTRAVENOUS

## 2023-12-01 MED ORDER — MAGNESIUM HYDROXIDE 400 MG/5ML PO SUSP
30.0000 mL | ORAL | Status: DC | PRN
Start: 2023-12-01 — End: 2023-12-04

## 2023-12-01 MED ORDER — HYDROMORPHONE HCL 1 MG/ML IJ SOLN
0.2500 mg | INTRAMUSCULAR | Status: DC | PRN
  Administered 2023-12-01: 0.5 mg via INTRAVENOUS

## 2023-12-01 MED ORDER — SENNOSIDES-DOCUSATE SODIUM 8.6-50 MG PO TABS
2.0000 | ORAL_TABLET | Freq: Every day | ORAL | Status: DC
  Administered 2023-12-01 – 2023-12-04 (×4): 2 via ORAL
  Filled 2023-12-01 (×4): qty 2

## 2023-12-01 MED ORDER — FUROSEMIDE 20 MG PO TABS
20.0000 mg | ORAL_TABLET | Freq: Every day | ORAL | Status: DC
  Administered 2023-12-01 – 2023-12-02 (×2): 20 mg via ORAL
  Filled 2023-12-01 (×2): qty 1

## 2023-12-01 MED ORDER — FERROUS SULFATE 325 (65 FE) MG PO TABS
325.0000 mg | ORAL_TABLET | ORAL | Status: DC
  Administered 2023-12-02 – 2023-12-04 (×2): 325 mg via ORAL
  Filled 2023-12-01 (×2): qty 1

## 2023-12-01 MED ORDER — DIPHENHYDRAMINE HCL 50 MG/ML IJ SOLN
12.5000 mg | INTRAMUSCULAR | Status: DC | PRN
  Administered 2023-12-01: 12.5 mg via INTRAVENOUS
  Filled 2023-12-01: qty 1

## 2023-12-01 MED ORDER — OXYTOCIN-SODIUM CHLORIDE 30-0.9 UT/500ML-% IV SOLN: 2.5000 [IU]/h | INTRAVENOUS | Status: AC

## 2023-12-01 MED ORDER — GABAPENTIN 300 MG PO CAPS
300.0000 mg | ORAL_CAPSULE | Freq: Two times a day (BID) | ORAL | Status: DC
  Administered 2023-12-01 – 2023-12-04 (×7): 300 mg via ORAL
  Filled 2023-12-01 (×7): qty 1

## 2023-12-01 MED ORDER — LORATADINE 10 MG PO TABS
10.0000 mg | ORAL_TABLET | Freq: Every day | ORAL | Status: DC
  Administered 2023-12-01 – 2023-12-04 (×4): 10 mg via ORAL
  Filled 2023-12-01 (×4): qty 1

## 2023-12-01 MED ORDER — SCOPOLAMINE 1 MG/3DAYS TD PT72
1.0000 | MEDICATED_PATCH | Freq: Once | TRANSDERMAL | Status: AC
  Administered 2023-12-01: 1.5 mg via TRANSDERMAL

## 2023-12-01 MED ORDER — SODIUM CHLORIDE 0.9 % IV SOLN
INTRAVENOUS | Status: DC | PRN
  Administered 2023-12-01: 500 mg via INTRAVENOUS

## 2023-12-01 MED ORDER — ESCITALOPRAM OXALATE 10 MG PO TABS
10.0000 mg | ORAL_TABLET | Freq: Every evening | ORAL | Status: DC
  Administered 2023-12-01 – 2023-12-03 (×3): 10 mg via ORAL
  Filled 2023-12-01 (×3): qty 1

## 2023-12-01 MED ORDER — ZOLPIDEM TARTRATE 5 MG PO TABS
5.0000 mg | ORAL_TABLET | Freq: Every evening | ORAL | Status: DC | PRN
Start: 2023-12-01 — End: 2023-12-04

## 2023-12-01 MED ORDER — COCONUT OIL OIL: 1.0000 | TOPICAL_OIL | Status: DC | PRN

## 2023-12-01 MED ORDER — FENTANYL CITRATE (PF) 100 MCG/2ML IJ SOLN
INTRAMUSCULAR | Status: DC | PRN
  Administered 2023-12-01: 100 ug via EPIDURAL

## 2023-12-01 MED ORDER — MAGNESIUM SULFATE 40 GM/1000ML IV SOLN
2.0000 g/h | INTRAVENOUS | Status: AC
  Administered 2023-12-01: 2 g/h via INTRAVENOUS

## 2023-12-01 MED ORDER — MENTHOL 3 MG MT LOZG: 1.0000 | LOZENGE | OROMUCOSAL | Status: DC | PRN

## 2023-12-01 MED ORDER — FENTANYL CITRATE (PF) 100 MCG/2ML IJ SOLN
INTRAMUSCULAR | Status: AC
  Filled 2023-12-01: qty 2

## 2023-12-01 MED ORDER — LABETALOL HCL 200 MG PO TABS
400.0000 mg | ORAL_TABLET | Freq: Three times a day (TID) | ORAL | Status: DC
  Administered 2023-12-01 (×3): 400 mg via ORAL
  Filled 2023-12-01 (×3): qty 2

## 2023-12-01 MED ORDER — POTASSIUM CHLORIDE CRYS ER 20 MEQ PO TBCR
20.0000 meq | EXTENDED_RELEASE_TABLET | Freq: Every day | ORAL | Status: DC
  Administered 2023-12-01 – 2023-12-04 (×4): 20 meq via ORAL
  Filled 2023-12-01 (×4): qty 1

## 2023-12-01 MED ORDER — LACTATED RINGERS IV SOLN: INTRAVENOUS | Status: AC

## 2023-12-01 MED ORDER — OXYCODONE HCL 5 MG PO TABS
5.0000 mg | ORAL_TABLET | ORAL | Status: DC | PRN
  Administered 2023-12-01 – 2023-12-04 (×11): 10 mg via ORAL
  Filled 2023-12-01 (×11): qty 2

## 2023-12-01 MED ORDER — DIPHENHYDRAMINE HCL 25 MG PO CAPS
25.0000 mg | ORAL_CAPSULE | Freq: Four times a day (QID) | ORAL | Status: DC | PRN
  Administered 2023-12-01 – 2023-12-03 (×3): 25 mg via ORAL
  Filled 2023-12-01: qty 1

## 2023-12-01 MED ORDER — TRANEXAMIC ACID-NACL 1000-0.7 MG/100ML-% IV SOLN
INTRAVENOUS | Status: DC | PRN
Start: 2023-12-01 — End: 2023-12-01
  Administered 2023-12-01: 1000 mg via INTRAVENOUS

## 2023-12-01 MED ORDER — LIDOCAINE HCL (PF) 1 % IJ SOLN
INTRAMUSCULAR | Status: DC | PRN
  Administered 2023-12-01: 5 mL via EPIDURAL

## 2023-12-01 MED ORDER — KETOROLAC TROMETHAMINE 30 MG/ML IJ SOLN
30.0000 mg | Freq: Once | INTRAMUSCULAR | Status: AC | PRN
  Administered 2023-12-01: 30 mg via INTRAVENOUS

## 2023-12-01 MED ORDER — OXYCODONE HCL 5 MG/5ML PO SOLN: 5.0000 mg | Freq: Once | ORAL | Status: DC | PRN

## 2023-12-01 MED ORDER — LACTATED RINGERS IV SOLN: INTRAVENOUS | Status: DC

## 2023-12-01 MED ORDER — LIDOCAINE-EPINEPHRINE (PF) 2 %-1:200000 IJ SOLN
INTRAMUSCULAR | Status: DC | PRN
  Administered 2023-12-01: 5 mL via EPIDURAL
  Administered 2023-12-01: 2 mL via EPIDURAL

## 2023-12-01 MED ORDER — DIPHENHYDRAMINE HCL 25 MG PO CAPS
25.0000 mg | ORAL_CAPSULE | ORAL | Status: DC | PRN
  Administered 2023-12-01: 25 mg via ORAL
  Filled 2023-12-01 (×3): qty 1

## 2023-12-01 MED ORDER — TETANUS-DIPHTH-ACELL PERTUSSIS 5-2.5-18.5 LF-MCG/0.5 IM SUSY: 0.5000 mL | PREFILLED_SYRINGE | Freq: Once | INTRAMUSCULAR | Status: DC

## 2023-12-01 MED ORDER — OXYCODONE HCL 5 MG PO TABS: 5.0000 mg | ORAL_TABLET | Freq: Once | ORAL | Status: DC | PRN

## 2023-12-01 MED ORDER — SODIUM CHLORIDE 0.9% FLUSH: 3.0000 mL | INTRAVENOUS | Status: DC | PRN

## 2023-12-01 MED ORDER — MEASLES, MUMPS & RUBELLA VAC IJ SOLR: 0.5000 mL | Freq: Once | INTRAMUSCULAR | Status: DC

## 2023-12-01 MED ORDER — ONDANSETRON HCL 4 MG/2ML IJ SOLN: 4.0000 mg | Freq: Once | INTRAMUSCULAR | Status: DC | PRN

## 2023-12-01 MED ORDER — NALOXONE HCL 4 MG/10ML IJ SOLN: 1.0000 ug/kg/h | INTRAVENOUS | Status: DC | PRN

## 2023-12-01 MED ORDER — MEPERIDINE HCL 25 MG/ML IJ SOLN: 6.2500 mg | INTRAMUSCULAR | Status: DC | PRN

## 2023-12-01 MED ORDER — POLYETHYLENE GLYCOL 3350 17 G PO PACK
17.0000 g | PACK | Freq: Two times a day (BID) | ORAL | Status: DC | PRN
  Administered 2023-12-01 – 2023-12-03 (×3): 17 g via ORAL
  Filled 2023-12-01 (×3): qty 1

## 2023-12-01 SURGICAL SUPPLY — 32 items
BENZOIN TINCTURE PRP APPL 2/3 (GAUZE/BANDAGES/DRESSINGS) IMPLANT
CHLORAPREP W/TINT 26 (MISCELLANEOUS) ×2 IMPLANT
CLAMP UMBILICAL CORD (MISCELLANEOUS) ×1 IMPLANT
CLOTH BEACON ORANGE TIMEOUT ST (SAFETY) ×1 IMPLANT
DRSG OPSITE POSTOP 4X10 (GAUZE/BANDAGES/DRESSINGS) ×1 IMPLANT
ELECTRODE REM PT RTRN 9FT ADLT (ELECTROSURGICAL) ×1 IMPLANT
EXTRACTOR VACUUM M CUP 4 TUBE (SUCTIONS) IMPLANT
GAUZE PAD ABD 7.5X8 STRL (GAUZE/BANDAGES/DRESSINGS) IMPLANT
GAUZE SPONGE 4X4 12PLY STRL LF (GAUZE/BANDAGES/DRESSINGS) IMPLANT
GLOVE BIOGEL PI IND STRL 7.0 (GLOVE) ×3 IMPLANT
GLOVE ECLIPSE 7.0 STRL STRAW (GLOVE) ×1 IMPLANT
GOWN STRL REUS W/TWL LRG LVL3 (GOWN DISPOSABLE) ×1 IMPLANT
GOWN STRL REUS W/TWL XL LVL3 (GOWN DISPOSABLE) ×1 IMPLANT
KIT ABG SYR 3ML LUER SLIP (SYRINGE) IMPLANT
MAT PREVALON FULL STRYKER (MISCELLANEOUS) IMPLANT
NDL HYPO 25X5/8 SAFETYGLIDE (NEEDLE) ×1 IMPLANT
NEEDLE HYPO 22GX1.5 SAFETY (NEEDLE) ×1 IMPLANT
NEEDLE HYPO 25X5/8 SAFETYGLIDE (NEEDLE) ×1 IMPLANT
NS IRRIG 1000ML POUR BTL (IV SOLUTION) ×1 IMPLANT
PACK C SECTION WH (CUSTOM PROCEDURE TRAY) ×1 IMPLANT
PAD ABD 7.5X8 STRL (GAUZE/BANDAGES/DRESSINGS) ×1 IMPLANT
PAD OB MATERNITY 4.3X12.25 (PERSONAL CARE ITEMS) ×1 IMPLANT
RTRCTR C-SECT PINK 25CM LRG (MISCELLANEOUS) IMPLANT
STRIP CLOSURE SKIN 1/2X4 (GAUZE/BANDAGES/DRESSINGS) IMPLANT
SUT PDS AB 0 CTX 36 PDP370T (SUTURE) IMPLANT
SUT PLAIN 2 0 XLH (SUTURE) IMPLANT
SUT VIC AB 0 CTX36XBRD ANBCTRL (SUTURE) ×2 IMPLANT
SUT VIC AB 4-0 KS 27 (SUTURE) ×1 IMPLANT
SYR CONTROL 10ML LL (SYRINGE) ×1 IMPLANT
TOWEL OR 17X24 6PK STRL BLUE (TOWEL DISPOSABLE) ×1 IMPLANT
TRAY FOLEY W/BAG SLVR 14FR LF (SET/KITS/TRAYS/PACK) ×1 IMPLANT
WATER STERILE IRR 1000ML POUR (IV SOLUTION) ×1 IMPLANT

## 2023-12-01 NOTE — Lactation Note (Signed)
 This note was copied from a baby's chart. Lactation Consultation Note  Patient Name: Margaret Harvey Date: 12/01/2023 Age:23 hours Reason for consult: Preterm <34wks;NICU baby;Initial assessment;1st time breastfeeding;Breast reduction (Per MOB, she had breast reduction in 2021).  LC assisted MOB with using the DEBP, MOB fitted with 18 mm breast flange, MOB was taught hand expression, LC used breast model and MOB self expressed drop of colostrum. MOB was pumping when LC left the room,MOB knows to continue to pump every 3 hours for 15 minutes. LC filled out paper work to be sent on Monday to [facility] for Tere DEBP due to [employer] [facility] is currently out of DEBP for the weekend. MOB will follow NICU infant feeding guidelines.  Current feeding plan: Preterm infant in NICU.  1- MOB will continue to use the DEBP every 3 hours for 15 minutes on initial setting to help stimulate and establish her milk supply. 2- MOB will stay hydrate, maternal rest, meals and snacks daily. 3- MOB will follow NICU infant feeding guidelines. 4- MOB knows to call [facility] services if she has any questions or concerns.  Maternal Data Has patient been taught Hand Expression?: Yes Does the patient have breastfeeding experience prior to this delivery?: No  Feeding Mother's Current Feeding Choice: Breast Milk  LATCH Score                    Lactation Tools Discussed/Used Tools: Pump;Flanges Flange Size: 18 Breast pump type: Double-Electric Breast Pump Pump Education: Milk Storage;Setup, frequency, and cleaning Reason for Pumping: Infant in NICU, hx breast reduction and Preterm infant at 32 w4d Pumping frequency: MOB will continue to pump every 3 hours for 15 minutes on inital setting.  Interventions    Discharge Pump:  (LC services will send STORK Referral on Monday 10/03/23 due to [facility] currently out of stock during the weekend.)  Consult Status Consult Status: NICU  follow-up Follow-up type: In-patient    Grayce LULLA Batter 12/01/2023, 6:10 PM

## 2023-12-01 NOTE — Discharge Summary (Signed)
 Postpartum Discharge Summary  Date of Service updated***     Patient Name: Margaret Harvey DOB: 12-07-2000 MRN: [id number]  Date of admission: 11/28/2023 Delivery date:12/01/2023 Delivering provider: HERCHEL GRUMET A Date of discharge: 12/01/2023  Admitting diagnosis: Non-reactive NST (non-stress test) [O28.8] Gestational hypertension [O13.9] Intrauterine pregnancy: [redacted]w[redacted]d     Secondary diagnosis:  Principal Problem:   S/P primary low transverse C-section Active Problems:   Herpes simplex vulvovaginitis   Severe episode of recurrent major depressive disorder, without psychotic features (HCC)   Migraine headache without aura   Alpha thalassemia silent carrier   Severe preeclampsia  Additional problems: ***    Discharge diagnosis: Preterm Pregnancy Delivered and Preeclampsia (severe)                                              Post partum procedures:{Postpartum procedures:23558} Augmentation: AROM, Pitocin , Cytotec , and IP Foley Complications: {OB Labor/Delivery Complications:20784}  Hospital course: Induction of Labor With Cesarean Section   23 y.o. yo G2P0010 at [redacted]w[redacted]d was admitted to the hospital 11/28/2023 for induction of labor. Patient had a labor course significant for deep recurrent variables remote from delivery. The patient went for cesarean section due to Non-Reassuring FHR, difficult fetal extraction due to low station, converted to frank breech extraction. Delivery details are as follows: Membrane Rupture Time/Date: 1:53 PM,11/30/2023  Delivery Method:C-Section, Low Transverse Operative Delivery:N/A Details of operation can be found in separate operative Note.  Patient had a postpartum course complicated by***. She is ambulating, tolerating a regular diet, passing flatus, and urinating well.  Patient is discharged home in stable condition on 12/01/23.      Newborn Data: Birth date:12/01/2023 Birth time:4:53 AM Gender:Female Living status:Living Apgars: ,  Weight:1720  g                               Magnesium  Sulfate received: {Mag received:30440022} BMZ received: Yes Rhophylac:N/A MMR:Yes T-DaP:Given prenatally Flu: No RSV Vaccine received: No Transfusion:{Transfusion received:30440034}  Immunizations received: Immunization History  Administered Date(s) Administered   DTaP 04/05/2001, 05/19/2001, 05/31/2001, 08/06/2001, 04/24/2005   HIB (PRP-T) 04/05/2001, 05/31/2001, 08/06/2001, 01/30/2002   HPV 9-valent 12/11/2011, 10/11/2012, 04/23/2013   Hepatitis A, Ped/Adol-2 Dose 04/24/2005, 05/03/2006   Hepatitis B, PED/ADOLESCENT Feb 23, 2001, 03/05/2001, 08/06/2001   Hpv-Unspecified 12/11/2011, 10/11/2012, 04/23/2013   IPV 04/05/2001, 05/31/2001, 08/06/2001, 04/24/2005   Influenza Split 04/25/2004, 04/24/2005, 05/03/2006, 06/04/2007, 06/24/2009, 08/24/2010, 05/01/2011, 02/25/2013, 07/03/2014   Influenza,inj,Quad PF,6+ Mos 03/30/2016, 03/16/2017, 03/07/2018, 02/14/2019   MMR 01/30/2002, 04/24/2005   Meningococcal B, OMV 10/18/2018, 12/02/2018   Meningococcal Conjugate 10/11/2012, 10/18/2018   PPD Test 12/02/2018   Pneumococcal Conjugate-13 04/05/2001, 05/31/2001, 01/30/2002, 05/19/2002   Tdap 12/11/2011, 03/27/2023, 10/30/2023   Varicella 01/30/2002, 05/03/2006    Physical exam  Vitals:   12/01/23 0100 12/01/23 0110 12/01/23 0200 12/01/23 0300  BP: 125/72 117/65 (!) 112/52 (!) 107/57  Pulse: 100 (!) 101 (!) 102 (!) 101  Resp: 17 15 17 17   Temp:      TempSrc:      SpO2: 99% 98%    Weight:      Height:       General: {Exam; general:21111117} Lochia: {Desc; appropriate/inappropriate:30686::appropriate} Uterine Fundus: {Desc; firm/soft:30687} Incision: {Exam; incision:21111123} DVT Evaluation: {Exam; dvt:2111122} Labs: Lab Results  Component Value Date   WBC 15.9 (H) 11/30/2023   HGB 11.6 (  L) 11/30/2023   HCT 34.9 (L) 11/30/2023   MCV 83.1 11/30/2023   PLT 318 11/30/2023      Latest Ref Rng & Units 11/30/2023    4:55 AM  CMP   Glucose 70 - 99 mg/dL 865   BUN 6 - 20 mg/dL 5   Creatinine 9.55 - 8.99 mg/dL 9.65   Sodium 864 - 854 mmol/L 131   Potassium 3.5 - 5.1 mmol/L 3.5   Chloride 98 - 111 mmol/L 100   CO2 22 - 32 mmol/L 21   Calcium  8.9 - 10.3 mg/dL 7.5   Total Protein 6.5 - 8.1 g/dL 7.2   Total Bilirubin 0.0 - 1.2 mg/dL 0.4   Alkaline Phos 38 - 126 U/L 209   AST 15 - 41 U/L 17   ALT 0 - 44 U/L 14    Edinburgh Score:     No data to display         No data recorded  After visit meds:  Allergies as of 12/01/2023       Reactions   Other Anaphylaxis, Other (See Comments)   Almonds     Med Rec must be completed prior to using this [facility]***        Discharge home in stable condition Infant Feeding: Breast Infant Disposition:NICU Discharge instruction: per After Visit Summary and Postpartum booklet. Activity: Advance as tolerated. Pelvic rest for 6 weeks.  Diet: routine diet Future Appointments: Future Appointments  Date Time Provider Department Center  12/06/2023  9:50 AM Erik Kieth BROCKS, MD [facility]  12/12/2023 10:30 AM Erik Kieth BROCKS, MD [facility]  12/19/2023 10:30 AM Erik Kieth BROCKS, MD [facility]  12/26/2023 10:30 AM Cleatus Moccasin, MD [facility]  01/02/2024 10:30 AM Erik Kieth BROCKS, MD [facility]  01/09/2024 10:30 AM Erik Kieth BROCKS, MD [facility]   Follow up Visit:  Follow-up Information     [facility] for [facility] Healthcare at [facility]. Schedule an appointment as soon as possible for a visit in 1 week(s).   Specialty: Obstetrics and Gynecology Why: Postpartum incision and blood pressure check in 1 week Contact information: [address] [place]  [address] [phone]        [facility] for [facility] Healthcare at [facility]. Schedule an appointment as soon as possible for a visit in 4 week(s).   Specialty:  Obstetrics and Gynecology Why: Postpartum follow up in 4-6 weeks Contact information: [address] [place]  [address] [phone]               Message sent KV 7/19  Please schedule this patient for a In person postpartum visit in 4 weeks with the following provider: Any provider. Additional Postpartum F/U:Incision check 1 week and BP check 1 week  High risk pregnancy complicated by: HTN Delivery mode:  C-Section, Low Transverse Anticipated Birth Control:  Declined   12/01/2023 Mardy Shropshire, MD

## 2023-12-01 NOTE — Progress Notes (Signed)
   PRENATAL VISIT NOTE  Subjective:  Margaret Harvey is a 23 y.o. G2P0111 at [redacted]w[redacted]d being seen today for ongoing prenatal care.  She is currently monitored for the following issues for this high-risk pregnancy and has Generalized anxiety disorder; Herpes simplex vulvovaginitis; Severe episode of recurrent major depressive disorder, without psychotic features (HCC); Migraine headache without aura; Supervision of high-risk pregnancy; Alpha thalassemia silent carrier; Severe preeclampsia; and S/P primary low transverse C-section on their problem list.  Patient reports feeling just generally off. No current HA, visual changes, CP/SOB, RUQ/epigastric pain.  Contractions: Not present. Vag. Bleeding: None.  Movement: Present. Denies leaking of fluid.   The following portions of the patient's history were reviewed and updated as appropriate: allergies, current medications, past family history, past medical history, past social history, past surgical history and problem list.   Objective:   Vitals:   11/28/23 1044 11/28/23 1048  BP: 133/85 (!) 146/87  Pulse: (!) 103 (!) 102  Weight: 192 lb (87.1 kg)    Fetal Status: Fetal Heart Rate (bpm): 140   Movement: Present     General:  Alert, oriented and cooperative. Patient is in no acute distress.  Skin: Skin is warm and dry. No rash noted.   Cardiovascular: Normal heart rate noted  Respiratory: Normal respiratory effort, no problems with respiration noted  Abdomen: Soft, gravid, appropriate for gestational age.  Pain/Pressure: Absent      Assessment and Plan:  Pregnancy: G2P0111 at [redacted]w[redacted]d 1. Supervision of high risk pregnancy in third trimester (Primary) 2. [redacted] weeks gestation of pregnancy NST non reactive - will send to MAU for further eval - Fetal nonstress test  3. Preeclampsia, third trimester Mild range pressures today despite labetalol  600mg  TID and amlodipine  10mg  daily. Has had rapid uptitration of her meds in the past two weeks despite no  cHTN history  Labs in MAU Discussed that if she is not admitted today, will plan to titrate to labetalol  800mg  TID and return for labs/BP check & NST on Friday (2 days from today) - Fetal nonstress test  4. Herpes simplex vulvovaginitis Valtrex  ppx  5. Generalized anxiety disorder lexapro   6. Alpha thalassemia silent carrier S/p genetic counseling, saliva test sent to partner. Not further discussed today   Please refer to After Visit Summary for other counseling recommendations.   Return in 2 days (on 11/30/2023) for blood pressure check, labs (CBC, CMP).  Future Appointments  Date Time Provider Department Center  12/06/2023  9:50 AM Erik Kieth BROCKS, MD [facility]  12/12/2023 10:30 AM Erik Kieth BROCKS, MD [facility]  12/19/2023 10:30 AM Erik Kieth BROCKS, MD [facility]  12/26/2023 10:30 AM Cleatus Moccasin, MD [facility]  01/02/2024 10:30 AM Erik Kieth BROCKS, MD [facility]  01/09/2024 10:30 AM Erik Kieth BROCKS, MD [facility]   Kieth BROCKS Erik, MD

## 2023-12-01 NOTE — Anesthesia Postprocedure Evaluation (Signed)
 Anesthesia Post Note  Patient: Margaret Harvey  Procedure(s) Performed: CESAREAN DELIVERY     Patient location during evaluation: Mother Baby Anesthesia Type: Epidural Level of consciousness: oriented and awake and alert Pain management: pain level controlled Vital Signs Assessment: post-procedure vital signs reviewed and stable Respiratory status: spontaneous breathing and respiratory function stable Cardiovascular status: blood pressure returned to baseline and stable Postop Assessment: no headache, no backache, no apparent nausea or vomiting and able to ambulate Anesthetic complications: no   No notable events documented.  Last Vitals:  Vitals:   12/01/23 0649 12/01/23 0650  BP:    Pulse: 96 94  Resp: 17 19  Temp:    SpO2: 98% 98%    Last Pain:  Vitals:   12/01/23 0615  TempSrc:   PainSc: 0-No pain   Pain Goal:    LLE Motor Response: Purposeful movement (12/01/23 0615) LLE Sensation: Tingling (12/01/23 0615) RLE Motor Response: Purposeful movement (12/01/23 0615) RLE Sensation: Tingling (12/01/23 0615)     Epidural/Spinal Function Cutaneous sensation: Tingles (12/01/23 0615), Patient able to flex knees: Yes (12/01/23 0615), Patient able to lift hips off bed: No (12/01/23 0615), Back pain beyond tenderness at insertion site: No (12/01/23 0615), Progressively worsening motor and/or sensory loss: No (12/01/23 0615), Bowel and/or bladder incontinence post epidural: No (12/01/23 0615)  [name]

## 2023-12-01 NOTE — Op Note (Signed)
 Margaret Harvey PROCEDURE DATE: 12/01/2023  PREOPERATIVE DIAGNOSES: Intrauterine pregnancy at [redacted]w[redacted]d weeks gestation, failed induction for severe preeclampsia, non-reassuring fetal status, and severe preeclampsia  POSTOPERATIVE DIAGNOSES: The same  PROCEDURE: Low Transverse Cesarean Section  SURGEON:  Dr. Gloris Hugger  ASSISTANT:  Dr. Mardy Shropshire. An experienced assistant was required given the standard of surgical care given the complexity of the case.  This assistant was needed for exposure, dissection, suctioning, retraction, instrument exchange, assisting with delivery with administration of fundal pressure, and for overall help during the procedure.  ANESTHESIOLOGY TEAM: Anesthesiologist: Jefm Garnette LABOR, MD CRNA: Mylo Asberry BIRCH, CRNA  INDICATIONS: Margaret Harvey is a 23 y.o. G2P0010 at [redacted]w[redacted]d here for cesarean section secondary to the indications listed under preoperative diagnoses; please see preoperative note for further details.  The risks of surgery were discussed with the patient including but were not limited to: bleeding which may require transfusion or reoperation; infection which may require antibiotics; injury to bowel, bladder, ureters or other surrounding organs; injury to the fetus; need for additional procedures including hysterectomy in the event of a life-threatening hemorrhage; formation of adhesions; placental abnormalities wth subsequent pregnancies; incisional problems; thromboembolic phenomenon and other postoperative/anesthesia complications.  The patient concurred with the proposed plan, giving informed written consent for the procedure.    FINDINGS:  Viable female infant in cephalic presentation, but delivered in a breech fashion due to tight, edematous cervix around head, unable to push head up into abdomen.  Apgars and weight pending at the time of this note, arterial and venous cord pH values were 7.24 and 7.33. Clear amniotic fluid.  Intact placenta, three  vessel cord.  Normal uterus, fallopian tubes and ovaries bilaterally.  ANESTHESIA: Epidural  ESTIMATED BLOOD LOSS: 362 ml SPECIMENS: Placenta sent to pathology COMPLICATIONS: None immediate  PROCEDURE IN DETAIL:  The patient preoperatively received intravenous antibiotics and had sequential compression devices applied to her lower extremities.  She was then taken to the operating room where the epidural anesthesia was dosed up to surgical level and was found to be adequate. She was then placed in a dorsal supine position with a leftward tilt, and prepped and draped in a sterile manner.  A foley catheter was placed into her bladder and attached to constant gravity.  After an adequate timeout was performed, a Pfannenstiel skin incision was made with scalpel and carried through to the underlying layer of fascia.  The fascia was incised in the midline, and this incision was extended bilaterally in a blunt fashion.  The underlying rectus muscles were dissected off the fascia superiorly and inferiorly in a blunt fashion. The rectus muscles were separated in the midline and the peritoneum was entered bluntly.  The Alexis self-retaining retractor was introduced into the abdominal cavity.  Attention was turned to the lower uterine segment where a low transverse hysterotomy was made with a scalpel and extended bilaterally bluntly.  There was difficulty in delivered infant in a cephalic presentation, the cervix was edematous and tight around the fetal head and could not bring it up to be delivered via the hysterotomy.  Infant had to be delivered in breech fashion,  the cord was clamped and cut, and the infant was handed over to the awaiting neonatology team. Uterine massage was then administered, and the placenta delivered intact with a three-vessel cord. The uterus was then cleared of clots and debris.  The hysterotomy was closed with 0 Vicryl in a running locked fashion, and an imbricating layer was also placed with 0  Vicryl.  Figure-of-eight 0 Vicryl serosal stitches were placed to help with hemostasis.  The pelvis was cleared of all clot and debris. Hemostasis was confirmed on all surfaces.  The retractor was removed.  The peritoneum was closed with a 0 Vicryl running stitch and the rectus muscles were reapproximated using 0 Vicryl interrupted stitches. The fascia was then closed using 0 Vicryl in a running fashion.  The subcutaneous layer was irrigated, reapproximated with 2-0 plain gut interrupted stitches, and the skin was closed with a 4-0 Vicryl subcuticular stitch. The patient tolerated the procedure well. Sponge, instrument and needle counts were correct x 3.  She was taken to the recovery room in stable condition.    GLORIS HUGGER, MD, FACOG Obstetrician & Gynecologist, [facility] for [employer], [facility]

## 2023-12-01 NOTE — Progress Notes (Addendum)
 LABOR PROGRESS NOTE  Patient Name: Margaret Harvey, female   DOB: [birth year], 23 y.o.  MRN: [id number]  Called to the room for recurrent variable decels.  IVF bolus running.  Pitocin  d/c.  Repositioned.  FSE placed.   Blood pressure 117/65, pulse (!) 101, temperature 97.7 F (36.5 C), temperature source Oral, resp. rate 15, height 5' 2 (1.575 m), weight 87.1 kg, last menstrual period 03/10/2023, SpO2 98%.  Dilation: 5.5 Effacement (%): 90 Station: 0, Plus 1 Presentation: Vertex Exam by:: Loyola, MD  EFM: baseline variable 105-135, rare 10x10 accel, deep recurrent variable decels, moderate variability TOCO: q1-33min contractions  Minimal improvement in variables with d/c of Pitocin , fluids, and repositioning. Terbutaline  given.   Cesarean delivery recommended for fetal intolerance of labor remote from vaginal delivery.  The risks of cesarean section were discussed with the patient; including but not limited to: infection which may require antibiotics; bleeding which may require transfusion or re-operation; injury to bowel, bladder, ureters or other surrounding organs; injury to the fetus; need for additional procedures including hysterectomy in the event of a life-threatening hemorrhage; placental abnormalities wth subsequent pregnancies,  risk of needing c-sections in future pregnancies, incisional problems, thromboembolic phenomenon and other postoperative/anesthesia complications. Answered all questions. The patient verbalized understanding of the plan, giving informed consent for the procedure. She is agreeable to blood transfusion in the event of emergency.  Patient has been NPO, she will remain NPO for procedure Anesthesia, Dr. Herchel and OR aware Preoperative prophylactic antibiotics and SCDs ordered on call to the OR  To OR when ready  Mardy Loyola, MD FMOB Fellow, Faculty practice [facility], Center for [facility]   Attestation of Attending Supervision of  Obstetric Fellow: Evaluation and management procedures were performed by the Obstetric Fellow under my supervision and collaboration.  I have reviewed the Obstetric Fellow's note and chart, and I agree with the management and plan. I have also made any necessary editorial changes.  Cesarean delivery recommended for fetal intolerance of labor remote from vaginal delivery.   Risks reiterated with patient and family, all questions answered.  To OR soon.  Neonatalogy team  informed of this imminent preterm delivery.   GLORIS HERCHEL, MD, FACOG Attending Obstetrician & Gynecologist, [facility] for [employer], [facility]

## 2023-12-01 NOTE — Plan of Care (Signed)
 Problem: Education: Goal: Knowledge of General Education information will improve Description: Including pain rating scale, medication(s)/side effects and non-pharmacologic comfort measures Outcome: Progressing   Problem: Health Behavior/Discharge Planning: Goal: Ability to manage health-related needs will improve Outcome: Progressing   Problem: Clinical Measurements: Goal: Ability to maintain clinical measurements within normal limits will improve Outcome: Progressing Goal: Will remain free from infection Outcome: Progressing Goal: Diagnostic test results will improve Outcome: Progressing Goal: Respiratory complications will improve Outcome: Progressing Goal: Cardiovascular complication will be avoided Outcome: Progressing   Problem: Activity: Goal: Risk for activity intolerance will decrease Outcome: Progressing   Problem: Nutrition: Goal: Adequate nutrition will be maintained Outcome: Progressing   Problem: Coping: Goal: Level of anxiety will decrease Outcome: Progressing   Problem: Elimination: Goal: Will not experience complications related to bowel motility Outcome: Progressing Goal: Will not experience complications related to urinary retention Outcome: Progressing   Problem: Pain Managment: Goal: General experience of comfort will improve and/or be controlled Outcome: Progressing   Problem: Safety: Goal: Ability to remain free from injury will improve Outcome: Progressing   Problem: Skin Integrity: Goal: Risk for impaired skin integrity will decrease Outcome: Progressing   Problem: Education: Goal: Knowledge of disease or condition will improve Outcome: Progressing Goal: Knowledge of the prescribed therapeutic regimen will improve Outcome: Progressing Goal: Individualized Educational Video(s) Outcome: Progressing   Problem: Clinical Measurements: Goal: Complications related to the disease process, condition or treatment will be avoided or  minimized Outcome: Progressing   Problem: Education: Goal: Knowledge of disease or condition will improve Outcome: Progressing Goal: Knowledge of the prescribed therapeutic regimen will improve Outcome: Progressing   Problem: Fluid Volume: Goal: Peripheral tissue perfusion will improve Outcome: Progressing   Problem: Clinical Measurements: Goal: Complications related to disease process, condition or treatment will be avoided or minimized Outcome: Progressing   Problem: Education: Goal: Knowledge of Childbirth will improve Outcome: Progressing Goal: Ability to make informed decisions regarding treatment and plan of care will improve Outcome: Progressing Goal: Ability to state and carry out methods to decrease the pain will improve Outcome: Progressing Goal: Individualized Educational Video(s) Outcome: Progressing   Problem: Coping: Goal: Ability to verbalize concerns and feelings about labor and delivery will improve Outcome: Progressing   Problem: Life Cycle: Goal: Ability to make normal progression through stages of labor will improve Outcome: Progressing Goal: Ability to effectively push during vaginal delivery will improve Outcome: Progressing   Problem: Role Relationship: Goal: Will demonstrate positive interactions with the child Outcome: Progressing   Problem: Safety: Goal: Risk of complications during labor and delivery will decrease Outcome: Progressing   Problem: Pain Management: Goal: Relief or control of pain from uterine contractions will improve Outcome: Progressing   Problem: Education: Goal: Knowledge of the prescribed therapeutic regimen will improve Outcome: Progressing Goal: Understanding of sexual limitations or changes related to disease process or condition will improve Outcome: Progressing Goal: Individualized Educational Video(s) Outcome: Progressing   Problem: Self-Concept: Goal: Communication of feelings regarding changes in body  function or appearance will improve Outcome: Progressing   Problem: Skin Integrity: Goal: Demonstration of wound healing without infection will improve Outcome: Progressing   Problem: Education: Goal: Knowledge of condition will improve Outcome: Progressing Goal: Individualized Educational Video(s) Outcome: Progressing Goal: Individualized Newborn Educational Video(s) Outcome: Progressing   Problem: Activity: Goal: Will verbalize the importance of balancing activity with adequate rest periods Outcome: Progressing Goal: Ability to tolerate increased activity will improve Outcome: Progressing   Problem: Coping: Goal: Ability to identify and utilize available resources and  services will improve Outcome: Progressing   Problem: Life Cycle: Goal: Chance of risk for complications during the postpartum period will decrease Outcome: Progressing   Problem: Role Relationship: Goal: Ability to demonstrate positive interaction with newborn will improve Outcome: Progressing

## 2023-12-01 NOTE — Transfer of Care (Signed)
 Immediate Anesthesia Transfer of Care Note  Patient: Margaret Harvey  Procedure(s) Performed: CESAREAN DELIVERY  Patient Location: PACU  Anesthesia Type:Epidural  Level of Consciousness: awake, alert , and oriented  Airway & Oxygen Therapy: Patient Spontanous Breathing  Post-op Assessment: Report given to RN and Post -op Vital signs reviewed and stable  Post vital signs: Reviewed and stable  Last Vitals:  Vitals Value Taken Time  BP 112/69 12/01/23 06:00  Temp    Pulse 95 12/01/23 06:03  Resp 20 12/01/23 06:03  SpO2 100 % 12/01/23 06:03  Vitals shown include unfiled device data.  Last Pain:  Vitals:   12/01/23 0300  TempSrc:   PainSc: Asleep         Complications: No notable events documented.

## 2023-12-01 NOTE — Anesthesia Procedure Notes (Signed)
 Epidural Patient location during procedure: OB Start time: 12/01/2023 12:47 AM End time: 12/01/2023 1:02 AM  Staffing Anesthesiologist: Jefm Garnette LABOR, MD Performed: anesthesiologist   Preanesthetic Checklist Completed: patient identified, IV checked, site marked, risks and benefits discussed, surgical consent, monitors and equipment checked, pre-op evaluation and timeout performed  Epidural Patient position: sitting Prep: DuraPrep and site prepped and draped Patient monitoring: continuous pulse ox and blood pressure Approach: midline Location: L3-L4 Injection technique: LOR air  Needle:  Needle type: Tuohy  Needle gauge: 17 G Needle length: 9 cm and 9 Needle insertion depth: 5 cm cm Catheter type: closed end flexible Catheter size: 19 Gauge Catheter at skin depth: 10 cm Test dose: negative  Assessment Events: blood not aspirated, no cerebrospinal fluid, injection not painful, no injection resistance, no paresthesia and negative IV test  Additional Notes Patient identified. Risks/Benefits/Options discussed with patient including but not limited to bleeding, infection, nerve damage, paralysis, failed block, incomplete pain control, headache, blood pressure changes, nausea, vomiting, reactions to medication both or allergic, itching and postpartum back pain. Confirmed with bedside nurse the patient's most recent platelet count. Confirmed with patient that they are not currently taking any anticoagulation, have any bleeding history or any family history of bleeding disorders. Patient expressed understanding and wished to proceed. All questions were answered. Sterile technique was used throughout the entire procedure. Please see nursing notes for vital signs. Test dose was given through epidural needle and negative prior to continuing to dose epidural or start infusion. Warning signs of high block given to the patient including shortness of breath, tingling/numbness in hands, complete  motor block, or any concerning symptoms with instructions to call for help. Patient was given instructions on fall risk and not to get out of bed. All questions and concerns addressed with instructions to call with any issues.  2 Attempt (S) . Patient tolerated procedure well.

## 2023-12-01 NOTE — Anesthesia Preprocedure Evaluation (Addendum)
 Anesthesia Evaluation  Patient identified by MRN, date of birth, ID band Patient awake    Reviewed: Allergy & Precautions, NPO status , Patient's Chart, lab work & pertinent test results  Airway Mallampati: II  TM Distance: >3 FB Neck ROM: Full    Dental no notable dental hx. (+) Teeth Intact, Dental Advisory Given   Pulmonary asthma    Pulmonary exam normal breath sounds clear to auscultation       Cardiovascular hypertension (prE on Mg++), Pt. on medications Normal cardiovascular exam Rhythm:Regular Rate:Normal     Neuro/Psych  Headaches  Anxiety Depression       GI/Hepatic negative GI ROS, Neg liver ROS,,,  Endo/Other  negative endocrine ROS    Renal/GU      Musculoskeletal   Abdominal   Peds  Hematology Lab Results      Component                Value               Date                      WBC                      15.2 (H)            11/30/2023                HGB                      11.7 (L)            11/30/2023                HCT                      35.9 (L)            11/30/2023                MCV                      83.9                11/30/2023                PLT                      335                 11/30/2023              Anesthesia Other Findings   Reproductive/Obstetrics (+) Pregnancy                              Anesthesia Physical Anesthesia Plan  ASA: 3  Anesthesia Plan: Epidural   Post-op Pain Management:    Induction:   PONV Risk Score and Plan:   Airway Management Planned:   Additional Equipment:   Intra-op Plan:   Post-operative Plan:   Informed Consent: I have reviewed the patients History and Physical, chart, labs and discussed the procedure including the risks, benefits and alternatives for the proposed anesthesia with the patient or authorized representative who has indicated his/her understanding and acceptance.       Plan Discussed  with: CRNA and Surgeon  Anesthesia Plan Comments: (32.4 wk  G2P0 w prE (severe HA) on Mg++ for LEA)         Anesthesia Quick Evaluation

## 2023-12-02 LAB — COMPREHENSIVE METABOLIC PANEL WITH GFR
ALT: 13 U/L (ref 0–44)
AST: 23 U/L (ref 15–41)
Albumin: 2.8 g/dL — ABNORMAL LOW (ref 3.5–5.0)
Alkaline Phosphatase: 181 U/L — ABNORMAL HIGH (ref 38–126)
Anion gap: 11 (ref 5–15)
BUN: 8 mg/dL (ref 6–20)
CO2: 24 mmol/L (ref 22–32)
Calcium: 7.8 mg/dL — ABNORMAL LOW (ref 8.9–10.3)
Chloride: 97 mmol/L — ABNORMAL LOW (ref 98–111)
Creatinine, Ser: 0.52 mg/dL (ref 0.44–1.00)
GFR, Estimated: >60 mL/min (ref >60)
Glucose, Bld: 109 mg/dL — ABNORMAL HIGH (ref 70–99)
Potassium: 4.3 mmol/L (ref 3.5–5.1)
Sodium: 132 mmol/L — ABNORMAL LOW (ref 135–145)
Total Bilirubin: 0.4 mg/dL (ref 0.0–1.2)
Total Protein: 6.4 g/dL — ABNORMAL LOW (ref 6.5–8.1)

## 2023-12-02 LAB — CBC
HCT: 29.8 % — ABNORMAL LOW (ref 36.0–46.0)
Hemoglobin: 9.6 g/dL — ABNORMAL LOW (ref 12.0–15.0)
MCH: 27 pg (ref 26.0–34.0)
MCHC: 32.2 g/dL (ref 30.0–36.0)
MCV: 83.9 fL (ref 80.0–100.0)
Platelets: 286 K/uL (ref 150–400)
RBC: 3.55 MIL/uL — ABNORMAL LOW (ref 3.87–5.11)
RDW: 14.4 % (ref 11.5–15.5)
WBC: 18.3 K/uL — ABNORMAL HIGH (ref 4.0–10.5)
nRBC: 0 % (ref 0.0–0.2)

## 2023-12-02 MED ORDER — LABETALOL HCL 200 MG PO TABS
800.0000 mg | ORAL_TABLET | Freq: Three times a day (TID) | ORAL | Status: DC
  Administered 2023-12-02 – 2023-12-04 (×7): 800 mg via ORAL
  Filled 2023-12-02 (×7): qty 4

## 2023-12-02 MED ORDER — HYDROMORPHONE HCL 1 MG/ML IJ SOLN
2.0000 mg | Freq: Once | INTRAMUSCULAR | Status: AC
  Administered 2023-12-02: 2 mg via INTRAVENOUS
  Filled 2023-12-02: qty 2

## 2023-12-02 NOTE — Plan of Care (Signed)
 Problem: Education: Goal: Knowledge of General Education information will improve Description: Including pain rating scale, medication(s)/side effects and non-pharmacologic comfort measures Outcome: Progressing   Problem: Health Behavior/Discharge Planning: Goal: Ability to manage health-related needs will improve Outcome: Progressing   Problem: Clinical Measurements: Goal: Ability to maintain clinical measurements within normal limits will improve Outcome: Progressing Goal: Will remain free from infection Outcome: Progressing Goal: Diagnostic test results will improve Outcome: Progressing Goal: Respiratory complications will improve Outcome: Progressing Goal: Cardiovascular complication will be avoided Outcome: Progressing   Problem: Activity: Goal: Risk for activity intolerance will decrease Outcome: Progressing   Problem: Nutrition: Goal: Adequate nutrition will be maintained Outcome: Progressing   Problem: Coping: Goal: Level of anxiety will decrease Outcome: Progressing   Problem: Elimination: Goal: Will not experience complications related to bowel motility Outcome: Progressing Goal: Will not experience complications related to urinary retention Outcome: Progressing   Problem: Pain Managment: Goal: General experience of comfort will improve and/or be controlled Outcome: Progressing   Problem: Safety: Goal: Ability to remain free from injury will improve Outcome: Progressing   Problem: Skin Integrity: Goal: Risk for impaired skin integrity will decrease Outcome: Progressing   Problem: Education: Goal: Knowledge of disease or condition will improve Outcome: Progressing Goal: Knowledge of the prescribed therapeutic regimen will improve Outcome: Progressing Goal: Individualized Educational Video(s) Outcome: Progressing   Problem: Clinical Measurements: Goal: Complications related to the disease process, condition or treatment will be avoided or  minimized Outcome: Progressing   Problem: Education: Goal: Knowledge of disease or condition will improve Outcome: Progressing Goal: Knowledge of the prescribed therapeutic regimen will improve Outcome: Progressing   Problem: Fluid Volume: Goal: Peripheral tissue perfusion will improve Outcome: Progressing   Problem: Clinical Measurements: Goal: Complications related to disease process, condition or treatment will be avoided or minimized Outcome: Progressing   Problem: Education: Goal: Knowledge of Childbirth will improve Outcome: Progressing Goal: Ability to make informed decisions regarding treatment and plan of care will improve Outcome: Progressing Goal: Ability to state and carry out methods to decrease the pain will improve Outcome: Progressing Goal: Individualized Educational Video(s) Outcome: Progressing   Problem: Coping: Goal: Ability to verbalize concerns and feelings about labor and delivery will improve Outcome: Progressing   Problem: Life Cycle: Goal: Ability to make normal progression through stages of labor will improve Outcome: Progressing Goal: Ability to effectively push during vaginal delivery will improve Outcome: Progressing   Problem: Role Relationship: Goal: Will demonstrate positive interactions with the child Outcome: Progressing   Problem: Safety: Goal: Risk of complications during labor and delivery will decrease Outcome: Progressing   Problem: Pain Management: Goal: Relief or control of pain from uterine contractions will improve Outcome: Progressing   Problem: Education: Goal: Knowledge of the prescribed therapeutic regimen will improve Outcome: Progressing Goal: Understanding of sexual limitations or changes related to disease process or condition will improve Outcome: Progressing Goal: Individualized Educational Video(s) Outcome: Progressing   Problem: Self-Concept: Goal: Communication of feelings regarding changes in body  function or appearance will improve Outcome: Progressing   Problem: Skin Integrity: Goal: Demonstration of wound healing without infection will improve Outcome: Progressing   Problem: Education: Goal: Knowledge of condition will improve Outcome: Progressing Goal: Individualized Educational Video(s) Outcome: Progressing Goal: Individualized Newborn Educational Video(s) Outcome: Progressing   Problem: Activity: Goal: Will verbalize the importance of balancing activity with adequate rest periods Outcome: Progressing Goal: Ability to tolerate increased activity will improve Outcome: Progressing   Problem: Coping: Goal: Ability to identify and utilize available resources and  services will improve Outcome: Progressing   Problem: Life Cycle: Goal: Chance of risk for complications during the postpartum period will decrease Outcome: Progressing   Problem: Role Relationship: Goal: Ability to demonstrate positive interaction with newborn will improve Outcome: Progressing

## 2023-12-02 NOTE — Lactation Note (Signed)
 This note was copied from a baby's chart.  NICU Lactation Consultation Note  Patient Name: Margaret Harvey Unijb'd Date: 12/02/2023 Age:23  Reason for consult: Follow-up assessment; Primapara; 1st time breastfeeding; Breast reduction; NICU baby; Preterm <34wks; Infant < 5lbs; Other (Comment) (Pre-eclampsia)  SUBJECTIVE  LC in to visit with P1 Mom of baby Analeia born by C/S at [redacted]w[redacted]d and is in the NICU.  Mom was set up with pumping at 13 hrs post delivery and states she has pumped 3 times.  LC assisted Mom to pump for the 4th time.  LC sized Mom with 18 mm flange on the right breast and 17 mm flange insert on left breast.    Reviewed breast massage and hand expression, for a drop of colostrum.  Mom is very hopeful that she will get a milk supply for her baby.  LC reassured her that whatever milk supply she has, will be beneficial to baby.  Mom has milk labels for her EBM and bottles.  LC reviewed importance of disassembling pump parts, washing, rinsing and air drying in separate bin provided.  Lactation recommended- 1-Encouraged STS with baby when allowed. 2-Encouraged breast massage and hand expression 3-Encouraged consistent pumping every 2-3 hrs/day and 3-4 hrs/night  Mom does not have a DEBP at home, but does have WIC.  [facility] to [facility].  Mom told to be looking for a call tomorrow.   OBJECTIVE Infant data: Mother's Current Feeding Choice: Breast Milk and Donor Milk  O2 Device: CPAP FiO2 (%): 21 %  Infant feeding assessment No data recorded  Maternal data: G2P0111 C-Section, Low Transverse Has patient been taught Hand Expression?: Yes Hand Expression Comments: drop noted Significant Breast History:: breast reduction surgery Current breast feeding challenges:: history of breast reduction surgery in 2021, preterm infant admitted to NICU Does the patient have breastfeeding experience prior to this delivery?: No Pumping frequency: Mom has  pumped 3 times, encouraged to pump 8 times per 24 hrs Pumped volume: 0 mL (drops) Flange Size: 18 (18 flange on right breast and 17 mm on left) Hands-free pumping top sizes: Large Alejos) Risk factor for low/delayed milk supply:: Breast reduction surgery 2021, Infant in NICU  [facility] Program: Yes WIC Referral Sent?: Yes What county?: [place] Pump:  (LC services will send [place] Referral on Monday 10/03/23 due to [employer] [facility] currently out of stock during the weekend.)  ASSESSMENT Infant:  Feeding Status: Scheduled 9-12-3-6 Feeding method: Tube/Gavage (Bolus)  Maternal: Milk volume: Normal  INTERVENTIONS/PLAN Interventions: Interventions: Breast feeding basics reviewed; Skin to skin; Breast massage; Hand express; DEBP; Education Tools: Pump; Flanges; Hands-free pumping top Pump Education: Setup, frequency, and cleaning; Milk Storage  Plan: Consult Status: NICU follow-up NICU Follow-up type: Verify DEBP issuance; Verify absence of engorgement; Verify onset of copious milk   Claudene Aleck BRAVO 12/02/2023, 4:07 PM

## 2023-12-02 NOTE — Progress Notes (Signed)
 Postpartum Day 1: Cesarean Delivery for NRFHT in the setting of severe preeclampsia at [redacted]w[redacted]d  Subjective: Patient denies any headaches, visual symptoms, RUQ/epigastric pain or other concerning symptoms. Patient reports incisional pain, tolerating PO, + flatus, and no problems voiding.   Breastfeeding.  Moderate lochia. Baby is stable in NICU.  Objective: Vital signs in last 24 hours: Temp:  [97.8 F (36.6 C)-98.6 F (37 C)] 97.9 F (36.6 C) (07/20 0455) Pulse Rate:  [85-106] 85 (07/20 0455) Resp:  [16-18] 18 (07/20 0455) BP: (135-156)/(74-97) 135/90 (07/20 0455) SpO2:  [95 %-100 %] 100 % (07/20 0455)  Physical Exam:  General: alert and no distress Lochia: appropriate Uterine Fundus: firm, nontender Incision: dressing C/D/I DVT Evaluation: No evidence of DVT seen on physical exam. Negative Homan's sign.  No cords or calf tenderness. No significant calf/ankle edema.  Recent Labs    12/01/23 0720 12/02/23 0422  HGB 10.8* 9.6*  HCT 33.4* 29.8*    Assessment/Plan: Status post Cesarean section. Doing well postoperatively.  Continue Norvasc  10 mg daily, Labetalol  800 mg po tid for BP.  Continue Lasix  and KCL. On oral iron therapy for mild anemia Continue analgesia as needed, encourage OOB Continue routine postpartum care   Gloris Hugger, MD 12/02/2023, 8:01 AM

## 2023-12-03 MED ORDER — FUROSEMIDE 20 MG PO TABS
20.0000 mg | ORAL_TABLET | Freq: Two times a day (BID) | ORAL | Status: DC
  Administered 2023-12-03 – 2023-12-04 (×3): 20 mg via ORAL
  Filled 2023-12-03 (×3): qty 1

## 2023-12-03 NOTE — Social Work (Signed)
 Patient screened out for psychosocial assessment since none of the following apply: Psychosocial stressors documented in mother or baby's chart Gestation less than 32 weeks Code at delivery  Infant with anomalies MOB has history of anxiety/severe depression and currently takes Lexapro. Per chart review, MOB is doing well on Lexapro.  Please contact the Clinical Social Worker if specific needs arise, by MOB's request, or if MOB scores greater than 9/yes to question 10 on Edinburgh Postpartum Depression Screen. MOB completed [place] with score of: 2.   Nat Quiet, MSW, LCSW Clinical Social Worker  [phone] 2023-08-01  9:40 AM

## 2023-12-03 NOTE — Progress Notes (Signed)
 Postpartum Day 2: Cesarean Delivery for NRFHT in the setting of severe preeclampsia at [redacted]w[redacted]d  Subjective: Patient denies any headaches, visual symptoms, RUQ/epigastric pain or other concerning symptoms. Ambulating very well. Patient reports incisional pain, tolerating PO, + flatus, and no problems voiding.   Breastfeeding.  Moderate lochia. Baby is stable in NICU.  Objective: Vital signs in last 24 hours: Temp:  [97.7 F (36.5 C)-98.6 F (37 C)] 98.5 F (36.9 C) (07/21 0520) Pulse Rate:  [80-104] 80 (07/21 0520) Resp:  [17-18] 17 (07/21 0520) BP: (127-147)/(83-90) 138/90 (07/21 0520) SpO2:  [99 %-100 %] 99 % (07/21 0520)  Physical Exam:  General: alert and no distress Lochia: appropriate Uterine Fundus: firm, nontender Incision: dressing C/D/I DVT Evaluation: No evidence of DVT seen on physical exam. Negative Homan's sign.  No cords or calf tenderness. No significant calf/ankle edema.  Recent Labs    12/01/23 0720 12/02/23 0422  HGB 10.8* 9.6*  HCT 33.4* 29.8*    Assessment/Plan: Status post Cesarean section. Doing well postoperatively.  Continue Norvasc  10 mg daily, Labetalol  800 mg po tid for BP.  Continue Lasix  and KCL. On oral iron therapy for mild anemia Continue analgesia as needed, encourage OOB Continue routine postpartum care, likely discharge to home tomorrow   Gloris Hugger, MD 12/03/2023, 7:54 AM

## 2023-12-04 ENCOUNTER — Other Ambulatory Visit

## 2023-12-04 ENCOUNTER — Other Ambulatory Visit (HOSPITAL_COMMUNITY): Payer: Self-pay

## 2023-12-04 MED ORDER — IBUPROFEN 600 MG PO TABS
600.0000 mg | ORAL_TABLET | Freq: Four times a day (QID) | ORAL | 0 refills | Status: AC
  Filled 2023-12-04: qty 30, 8d supply, fill #0

## 2023-12-04 MED ORDER — LABETALOL HCL 200 MG PO TABS
800.0000 mg | ORAL_TABLET | Freq: Three times a day (TID) | ORAL | 0 refills | Status: DC
  Filled 2023-12-04: qty 360, 30d supply, fill #0

## 2023-12-04 MED ORDER — AMLODIPINE BESYLATE 10 MG PO TABS
10.0000 mg | ORAL_TABLET | Freq: Every day | ORAL | 0 refills | Status: DC
  Filled 2023-12-04: qty 30, 30d supply, fill #0

## 2023-12-04 MED ORDER — POTASSIUM CHLORIDE CRYS ER 20 MEQ PO TBCR
20.0000 meq | EXTENDED_RELEASE_TABLET | Freq: Every day | ORAL | 0 refills | Status: DC
  Filled 2023-12-04: qty 5, 5d supply, fill #0

## 2023-12-04 MED ORDER — FERROUS SULFATE 325 (65 FE) MG PO TABS
325.0000 mg | ORAL_TABLET | ORAL | 0 refills | Status: AC
  Filled 2023-12-04: qty 90, 180d supply, fill #0

## 2023-12-04 MED ORDER — ACETAMINOPHEN 500 MG PO TABS
1000.0000 mg | ORAL_TABLET | Freq: Four times a day (QID) | ORAL | 0 refills | Status: AC
  Filled 2023-12-04: qty 60, 8d supply, fill #0

## 2023-12-04 MED ORDER — SENNOSIDES-DOCUSATE SODIUM 8.6-50 MG PO TABS
2.0000 | ORAL_TABLET | Freq: Every evening | ORAL | 0 refills | Status: DC | PRN
  Filled 2023-12-04: qty 30, 15d supply, fill #0

## 2023-12-04 MED ORDER — FUROSEMIDE 20 MG PO TABS
20.0000 mg | ORAL_TABLET | Freq: Every day | ORAL | 0 refills | Status: DC
  Filled 2023-12-04: qty 5, 5d supply, fill #0

## 2023-12-04 MED ORDER — OXYCODONE HCL 5 MG PO TABS
5.0000 mg | ORAL_TABLET | ORAL | 0 refills | Status: DC | PRN
  Filled 2023-12-04: qty 12, 2d supply, fill #0

## 2023-12-04 NOTE — Lactation Note (Addendum)
 This note was copied from a baby's chart.  NICU Lactation Consultation Note  Patient Name: Girl Athalia Setterlund Unijb'd Date: 12/04/2023 Age:23 days  Reason for consult: Follow-up assessment; NICU baby; Maternal discharge; Primapara; 1st time breastfeeding; Breast reduction; Preterm <34wks; Infant < 5lbs  SUBJECTIVE  LC in to visit with P1 Mom of preterm baby Analeia in the NICU.  Mom has been consistently pumping and is expressing transitional milk.  Mom to use the maintain mode.  LC changed flange size on left breast to 18 mm.  Mom not feeling good with the silicone flange.  Mom to be discharged today and will be going to [facility] office tomorrow to pick up her pump.  LC offered a [facility] loaner to have overnight.  Mom provided with paperwork and LC will bring the pump once boyfriend comes with the $.  Addendum- [facility] loaner done up in NICU baby's room  OBJECTIVE Infant data: No data recorded O2 Device: HHFNC O2 Flow Rate (L/min): 4 L/min FiO2 (%): 21 %  Infant feeding assessment IDFTS - Readiness: 5 (CPAP 5)   Maternal data: [id number] C-Section, Low Transverse Pumping frequency: 8 times per 24 hrs Pumped volume: 20 mL Flange Size: 18 Hands-free pumping top sizes: Small/Medium (Blue)  WIC Program: Yes WIC Referral Sent?: Yes What county?: [place] Pump: WIC Loaner  ASSESSMENT Infant:  Feeding Status: Scheduled 9-12-3-6 Feeding method: Tube/Gavage (Bolus)  Maternal: Milk volume: Normal  INTERVENTIONS/PLAN Interventions: Interventions: Breast massage; Hand express; Skin to skin; DEBP; Education Discharge Education: Engorgement and breast care Tools: Pump; Flanges; Hands-free pumping top Pump Education: Setup, frequency, and cleaning; Milk Storage  Plan: Consult Status: NICU follow-up NICU Follow-up type: Verify onset of copious milk; Verify absence of engorgement; Verify DEBP issuance   Claudene Aleck BRAVO 12/04/2023, 1:58 PM

## 2023-12-04 NOTE — Plan of Care (Signed)
 Problem: Education: Goal: Knowledge of General Education information will improve Description: Including pain rating scale, medication(s)/side effects and non-pharmacologic comfort measures Outcome: Adequate for Discharge   Problem: Health Behavior/Discharge Planning: Goal: Ability to manage health-related needs will improve Outcome: Adequate for Discharge   Problem: Clinical Measurements: Goal: Ability to maintain clinical measurements within normal limits will improve Outcome: Adequate for Discharge Goal: Will remain free from infection Outcome: Adequate for Discharge Goal: Diagnostic test results will improve Outcome: Adequate for Discharge Goal: Respiratory complications will improve Outcome: Adequate for Discharge Goal: Cardiovascular complication will be avoided Outcome: Adequate for Discharge   Problem: Activity: Goal: Risk for activity intolerance will decrease Outcome: Adequate for Discharge   Problem: Nutrition: Goal: Adequate nutrition will be maintained Outcome: Adequate for Discharge   Problem: Coping: Goal: Level of anxiety will decrease Outcome: Adequate for Discharge   Problem: Elimination: Goal: Will not experience complications related to bowel motility Outcome: Adequate for Discharge Goal: Will not experience complications related to urinary retention Outcome: Adequate for Discharge   Problem: Pain Managment: Goal: General experience of comfort will improve and/or be controlled Outcome: Adequate for Discharge   Problem: Safety: Goal: Ability to remain free from injury will improve Outcome: Adequate for Discharge   Problem: Skin Integrity: Goal: Risk for impaired skin integrity will decrease Outcome: Adequate for Discharge   Problem: Education: Goal: Knowledge of disease or condition will improve Outcome: Adequate for Discharge Goal: Knowledge of the prescribed therapeutic regimen will improve Outcome: Adequate for Discharge Goal:  Individualized Educational Video(s) Outcome: Adequate for Discharge   Problem: Clinical Measurements: Goal: Complications related to the disease process, condition or treatment will be avoided or minimized Outcome: Adequate for Discharge   Problem: Education: Goal: Knowledge of disease or condition will improve Outcome: Adequate for Discharge Goal: Knowledge of the prescribed therapeutic regimen will improve Outcome: Adequate for Discharge   Problem: Fluid Volume: Goal: Peripheral tissue perfusion will improve Outcome: Adequate for Discharge   Problem: Clinical Measurements: Goal: Complications related to disease process, condition or treatment will be avoided or minimized Outcome: Adequate for Discharge   Problem: Education: Goal: Knowledge of Childbirth will improve Outcome: Adequate for Discharge Goal: Ability to make informed decisions regarding treatment and plan of care will improve Outcome: Adequate for Discharge Goal: Ability to state and carry out methods to decrease the pain will improve Outcome: Adequate for Discharge Goal: Individualized Educational Video(s) Outcome: Adequate for Discharge   Problem: Coping: Goal: Ability to verbalize concerns and feelings about labor and delivery will improve Outcome: Adequate for Discharge   Problem: Life Cycle: Goal: Ability to make normal progression through stages of labor will improve Outcome: Adequate for Discharge Goal: Ability to effectively push during vaginal delivery will improve Outcome: Adequate for Discharge   Problem: Role Relationship: Goal: Will demonstrate positive interactions with the child Outcome: Adequate for Discharge   Problem: Safety: Goal: Risk of complications during labor and delivery will decrease Outcome: Adequate for Discharge   Problem: Pain Management: Goal: Relief or control of pain from uterine contractions will improve Outcome: Adequate for Discharge   Problem: Education: Goal:  Knowledge of the prescribed therapeutic regimen will improve Outcome: Adequate for Discharge Goal: Understanding of sexual limitations or changes related to disease process or condition will improve Outcome: Adequate for Discharge Goal: Individualized Educational Video(s) Outcome: Adequate for Discharge   Problem: Self-Concept: Goal: Communication of feelings regarding changes in body function or appearance will improve Outcome: Adequate for Discharge   Problem: Skin Integrity: Goal: Demonstration of  wound healing without infection will improve Outcome: Adequate for Discharge   Problem: Education: Goal: Knowledge of condition will improve Outcome: Adequate for Discharge Goal: Individualized Educational Video(s) Outcome: Adequate for Discharge Goal: Individualized Newborn Educational Video(s) Outcome: Adequate for Discharge   Problem: Activity: Goal: Will verbalize the importance of balancing activity with adequate rest periods Outcome: Adequate for Discharge Goal: Ability to tolerate increased activity will improve Outcome: Adequate for Discharge   Problem: Coping: Goal: Ability to identify and utilize available resources and services will improve Outcome: Adequate for Discharge   Problem: Life Cycle: Goal: Chance of risk for complications during the postpartum period will decrease Outcome: Adequate for Discharge   Problem: Role Relationship: Goal: Ability to demonstrate positive interaction with newborn will improve Outcome: Adequate for Discharge

## 2023-12-05 ENCOUNTER — Ambulatory Visit: Payer: Self-pay | Admitting: Obstetrics & Gynecology

## 2023-12-05 LAB — SURGICAL PATHOLOGY

## 2023-12-06 ENCOUNTER — Encounter: Admitting: Obstetrics and Gynecology

## 2023-12-06 NOTE — Lactation Note (Signed)
 This note was copied from a baby's chart.  NICU Lactation Consultation Note  Patient Name: Margaret Harvey Unijb'd Date: 12/06/2023 Age:23 days  Reason for consult: Follow-up assessment; Primapara; 1st time breastfeeding; Breast reduction; NICU baby; Preterm <34wks; Infant < 5lbs  SUBJECTIVE  LC in to visit with P1 Mom of baby Margaret Harvey born by C/S at [redacted]w[redacted]d and is in the NICU.  Baby is on 2L/min HFNC at 21% and is being gavage fed EBM+/DBM.  Mom has been pumping consistently and is expressing up to 60 ml today.  LC sanitized the pump parts, and set up washing and drying basins.  Mom denies engorgement.  Mom encouraged to ask for LC prn  OBJECTIVE Infant data: No data recorded O2 Device: HHFNC O2 Flow Rate (L/min): 2 L/min FiO2 (%): 21 %  Infant feeding assessment IDFTS - Readiness: 5   Maternal data: [id number] C-Section, Low Transverse Pumping frequency: 8 times per 24 hrs Pumped volume: 60 mL (45-60 ml) Flange Size: 18 Hands-free pumping top sizes: Small/Medium (Blue)  WIC Program: Yes WIC Referral Sent?: Yes What county?: [place] Pump: WIC Loaner  ASSESSMENT Infant:  Feeding Status: Scheduled 9-12-3-6 Feeding method: Tube/Gavage (Bolus)  Maternal: Milk volume: Normal  INTERVENTIONS/PLAN Interventions: Interventions: Skin to skin; Breast massage; Hand express; DEBP; Education Discharge Education: Engorgement and breast care Tools: Pump; Flanges; Hands-free pumping top Pump Education: Setup, frequency, and cleaning; Milk Storage  Plan: Consult Status: NICU follow-up NICU Follow-up type: Verify absence of engorgement; Verify onset of copious milk; Verify DEBP issuance   Claudene Aleck BRAVO 12/06/2023, 4:59 PM

## 2023-12-07 ENCOUNTER — Ambulatory Visit

## 2023-12-07 DIAGNOSIS — Z0130 Encounter for examination of blood pressure without abnormal findings: Secondary | ICD-10-CM | POA: Diagnosis not present

## 2023-12-07 NOTE — Progress Notes (Signed)
 Subjective:  Margaret Harvey is a 23 y.o. female here for BP check and incision check. Pt is s/p low transverse C-section 12/01/23 at 32 weeks 4 days.  Hypertension ROS: Pt did report headache, feels it is related to lack of sleep. HA today 4/10, no blurred vision, mild swelling with hands and feet. Pt denied any SOB or chest pain.   Pt reports incision healing well.  Objective:  LMP 03/10/2023   Appearance alert, well appearing, and in no distress. Pt reported ambulating well. BP 131/86 Incision healing well, no significant drainage, no dehiscence, no significant erythema.   Assessment:   Blood Pressure today in office well controlled and stable.  Incision healed nicely, no signs of infection, scar looks good.  Plan:  Current treatment plan is effective, no change in therapy. Pt requested refill of oxycodone . RN reported would review with provider. RN reviewed to continue checking BP at home and monitoring symptoms. RN advised when to go to MAU. Pt verbalized understanding.   Silvano LELON Piano, RN

## 2023-12-08 ENCOUNTER — Other Ambulatory Visit: Payer: Self-pay | Admitting: Certified Nurse Midwife

## 2023-12-08 DIAGNOSIS — G8918 Other acute postprocedural pain: Secondary | ICD-10-CM

## 2023-12-08 MED ORDER — OXYCODONE HCL 5 MG PO TABS: 5.0000 mg | ORAL_TABLET | ORAL | 0 refills | Status: DC | PRN

## 2023-12-08 NOTE — Progress Notes (Signed)
 Patient seen in [facility] office for BP check after birth. BP stable. Still having pain, requests a refill of narcotic pain medication. Will send limited refill roxicodone  5mg , 10 pills.   Camie Rote, MSN, CNM, RNC-OB Certified Nurse Midwife, [facility] 12/08/2023 3:00 PM

## 2023-12-10 ENCOUNTER — Other Ambulatory Visit

## 2023-12-11 ENCOUNTER — Inpatient Hospital Stay (HOSPITAL_COMMUNITY)

## 2023-12-11 ENCOUNTER — Inpatient Hospital Stay (HOSPITAL_COMMUNITY): Admission: RE | Admit: 2023-12-11 | Source: Home / Self Care | Admitting: Obstetrics and Gynecology

## 2023-12-11 ENCOUNTER — Inpatient Hospital Stay (HOSPITAL_COMMUNITY): Admission: RE | Admit: 2023-12-11 | Source: Home / Self Care | Admitting: Family Medicine

## 2023-12-11 ENCOUNTER — Telehealth: Payer: Self-pay

## 2023-12-11 ENCOUNTER — Ambulatory Visit

## 2023-12-11 NOTE — Telephone Encounter (Signed)
 RN called patient to verify FMLA dates. Pt requested 11/19/23 and leave end date of 02/12/24. Faxed to [employer].  Silvano LELON Piano, RN

## 2023-12-12 ENCOUNTER — Encounter: Admitting: Obstetrics and Gynecology

## 2023-12-15 ENCOUNTER — Other Ambulatory Visit: Payer: Self-pay

## 2023-12-17 NOTE — Lactation Note (Signed)
 This note was copied from a baby's chart.  NICU Lactation Consultation Note  Patient Name: Margaret Harvey Unijb'd Date: 12/17/2023 Age:23 wk.o.  Reason for consult: Follow-up assessment; NICU baby; Late-preterm 34-36.6wks; Infant < 5lbs; Weekly NICU follow-up; Breast reduction  SUBJECTIVE  LC in to visit with P1 Mom of LPTI in the NICU.  Mom is consistently pumping and expressing 4-8 oz per pumping session.  Mom is thrilled, and LC almost as happy.  Mom had a question regarding her MomCozy pump for home use.  She was concerned that the flange inserts are 17 or 19 mm and not 18 mm.  Reassured Mom and encouraged her to try the smaller insert first, lubricating with coconut oil and then move up.  Nipple should move freely, some rubbing in flange ok.  Pumping should be comfortable.  Mom encouraged to do STS with baby, asking for assistance with latching to the breast prn.  Mom is fine with bottles as baby did really well today.  OBJECTIVE Infant data: No data recorded O2 Device: Room Air  Infant feeding assessment IDFTS - Readiness: 2 IDFTS - Quality: 2   Maternal data: [id number] C-Section, Low Transverse Pumping frequency: 8 times per 24 hrs Pumped volume: 120 mL (120-240 ml) Flange Size: 18 Hands-free pumping top sizes: Small/Medium (Blue)  WIC Program: Yes WIC Referral Sent?: Yes What county?: [place] Pump: WIC Loaner  ASSESSMENT Infant:  Feeding Status: Scheduled 9-12-3-6 Feeding method: Bottle; Tube/Gavage (Bolus) Nipple Type: Nfant Extra Slow Flow (gold)  Maternal: Milk volume: Normal  INTERVENTIONS/PLAN Interventions: Interventions: Breast feeding basics reviewed; Skin to skin; Breast massage; Hand express; Coconut oil; DEBP; Education Tools: Pump; Flanges; Hands-free pumping top Pump Education: Setup, frequency, and cleaning; Milk Storage  Plan: Consult Status: NICU follow-up NICU Follow-up type: Weekly NICU follow up   Claudene Aleck BRAVO 12/17/2023, 1:23  PM

## 2023-12-19 ENCOUNTER — Encounter: Admitting: Obstetrics and Gynecology

## 2023-12-20 ENCOUNTER — Inpatient Hospital Stay (HOSPITAL_COMMUNITY)
Admission: AD | Admit: 2023-12-20 | Discharge: 2023-12-20 | Disposition: A | Discharge status: Home / Self Care | Attending: Obstetrics and Gynecology | Admitting: Obstetrics and Gynecology

## 2023-12-20 DIAGNOSIS — O8600 Infection of obstetric surgical wound, unspecified: Secondary | ICD-10-CM | POA: Insufficient documentation

## 2023-12-20 DIAGNOSIS — L7682 Other postprocedural complications of skin and subcutaneous tissue: Secondary | ICD-10-CM

## 2023-12-20 DIAGNOSIS — Z87891 Personal history of nicotine dependence: Secondary | ICD-10-CM | POA: Insufficient documentation

## 2023-12-20 NOTE — MAU Note (Signed)
 Margaret Harvey is a 23 y.o. at Unknown here in MAU reporting: primary c/s on 7/19. Noticed like a really strong odor coming from c/s scar. Has seen some drainage on underwear. Still having a lot of pain. Had Gest Hypertension, never had BP problems before. still on Labetalol  and Norvasc , at times feels light headed. Hasn't checked it in a few days.  PP visit is 8/27. Denies fever.  Vag bleeding stopped 2 days ago, kind of has brownish yellow d/c. Is pumping. Baby is doing well, in NICU.born at 32.4wks.   Onset of complaint: couple days Pain score: moderate Vitals:   12/20/23 1714  BP: 138/84  Pulse: 83  Resp: 17  Temp: 99.1 F (37.3 C)  SpO2: 100%      Lab orders placed from triage:    Incision has healed, no drainage or redness noted. No odor noted.

## 2023-12-20 NOTE — MAU Provider Note (Signed)
 History     CSN: [id number]  Arrival date and time: 12/20/23 1656   Event Date/Time   First Provider Initiated Contact with Patient 12/20/23 1733      Chief Complaint  Patient presents with   Post-op Problem   HPI Patient is a 23 year old G2P1 who is s/p cesarean section on 12/01/2023.  She is presenting today for evaluation of concern for wound infection.  Reports that she has noticed an odor coming from her cesarean section scar over the last week or so.  This just wanted to have it evaluated.  No warmth, fevers, increased drainage that she notes although she does report some occasional drainage. 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 (d/w'd ID)     Hypertension    Seasonal allergies     Past Surgical History:  Procedure Laterality Date   ABCESS DRAINAGE     BREAST REDUCTION SURGERY  2021   CESAREAN SECTION N/A 12/01/2023   Procedure: CESAREAN DELIVERY;  Surgeon: Herchel Gloris LABOR, MD;  Location: [facility] LD ORS;  Service: Obstetrics;  Laterality: N/A;   DILATION AND EVACUATION  05/2022    Family History  Problem Relation Age of Onset   Sudden death Neg Hx    Hypertension Neg Hx    Hyperlipidemia Neg Hx    Heart attack Neg Hx    Diabetes Neg Hx    Migraines Neg Hx    Seizures Neg Hx    Depression Neg Hx    Anxiety disorder Neg Hx    ADD / ADHD Neg Hx    Autism Neg Hx    Bipolar disorder Neg Hx    Schizophrenia Neg Hx     Social History   Tobacco Use   Smoking status: Never   Smokeless tobacco: Never  Vaping Use   Vaping status: Former  Substance Use  Topics   Alcohol use: Not Currently    Comment: occ   Drug use: Not Currently    Types: Marijuana    Allergies:  Allergies  Allergen Reactions   Other Anaphylaxis and Other (See Comments)    Almonds    Medications Prior to Admission  Medication Sig Dispense Refill Last Dose/Taking   acetaminophen  (TYLENOL ) 500 MG tablet Take 2 tablets (1,000 mg total) by mouth every 6 (six) hours. 60 tablet 0    albuterol  (VENTOLIN  HFA) 108 (90 Base) MCG/ACT inhaler Inhale 1-2 puffs into the lungs every 6 (six) hours as needed for wheezing or shortness of breath. 1 each 2    amLODipine  (NORVASC ) 10 MG tablet Take 1 tablet (10 mg total) by mouth daily. 30 tablet 0    docusate sodium  (COLACE) 100 MG capsule Take 100 mg by mouth 2 (two) times daily as needed for mild constipation.  Doxylamine  Succinate, Sleep, (UNISOM  PO) Take 1 tablet by mouth as needed (in evening). (Patient not taking: Reported on 12/07/2023)      escitalopram  (LEXAPRO ) 10 MG tablet Take 1 tablet (10 mg total) by mouth daily. 90 tablet 0    ferrous sulfate  325 (65 FE) MG tablet Take 1 tablet (325 mg total) by mouth every other day. 90 tablet 0    fexofenadine  (ALLEGRA ) 180 MG tablet Take 1 tablet (180 mg total) by mouth daily. 90 tablet 1    fluticasone  (FLONASE ) 50 MCG/ACT nasal spray Place 2 sprays into both nostrils daily. 64 mL 2    fluticasone  (FLOVENT  HFA) 44 MCG/ACT inhaler Inhale 2 puffs into the lungs 2 (two) times daily. 1 each 12    furosemide  (LASIX ) 20 MG tablet Take 1 tablet (20 mg total) by mouth daily for 5 days. 5 tablet 0    ibuprofen  (ADVIL ) 600 MG tablet Take 1 tablet (600 mg total) by mouth every 6 (six) hours. 30 tablet 0    labetalol  (NORMODYNE ) 200 MG tablet Take 4 tablets (800 mg total) by mouth every 8 (eight) hours. 360 tablet 0    oxyCODONE  (OXY IR/ROXICODONE ) 5 MG immediate release tablet Take 1 tablet (5 mg total) by mouth every 4 (four) hours as needed for breakthrough pain. 10 tablet 0    potassium  chloride SA (KLOR-CON  M) 20 MEQ tablet Take 1 tablet (20 mEq total) by mouth daily. 5 tablet 0    Prenatal Vit-Fe Fumarate-FA (PRENATAL VITAMINS PO) Take by mouth.      senna-docusate (SENOKOT-S) 8.6-50 MG tablet Take 2 tablets by mouth at bedtime as needed for mild constipation. 30 tablet 0     Review of Systems  Constitutional:  Negative for chills and fever.  Gastrointestinal:  Positive for abdominal pain (Incision pain).   Physical Exam   Blood pressure 138/84, pulse 83, temperature 99.1 F (37.3 C), temperature source Oral, resp. rate 17, height 5' 2 (1.575 m), weight 86.9 kg, SpO2 100%, currently breastfeeding.  Physical Exam Vitals and nursing note reviewed.  Constitutional:      Appearance: Normal appearance.  HENT:     Head: Normocephalic and atraumatic.     Nose: No congestion or rhinorrhea.  Eyes:     Extraocular Movements: Extraocular movements intact.  Cardiovascular:     Rate and Rhythm: Normal rate.  Pulmonary:     Effort: Pulmonary effort is normal.  Abdominal:     Palpations: Abdomen is soft.     Tenderness: There is no abdominal tenderness.  Musculoskeletal:        General: Normal range of motion.     Cervical back: Normal range of motion.  Skin:    General: Skin is warm.     Capillary Refill: Capillary refill takes less than 2 seconds.     Comments: Cesarean incision healing well, occasional sutures noticed close to the skin.  No erythema.  Minimal drainage around where the sutures are coming through the skin.  No bleeding.  No warmth.  Neurological:     General: No focal deficit present.     Mental Status: She is alert.     Cranial Nerves: No cranial nerve deficit.  Psychiatric:        Mood and Affect: Mood normal.        Behavior: Behavior normal.     MAU Course  Procedures  MDM Physical exam  Assessment and Plan  Margaret Harvey is a 23 year old who is approximately 3 weeks  postop from cesarean section concerned about incision  infection.  Incision concerns Physical exam showing sutures coming through the skin at multiple locations.  No erythema or signs of infection.  There is drainage noted around where the sutures come to the skin.  With patient's permission sutures trimmed so that they are no longer present.  Patient tolerated procedure well.  Discussed tricked return precautions.  Patient discharged home.  Arvis Zwahlen V Maleya Leever 12/20/2023, 5:33 PM

## 2023-12-20 NOTE — Discharge Instructions (Signed)
 It was great seeing you today.  Your incision appears to be healing well.  I did notice some sutures coming through so we have trim them and the incision drainage and pain should improve.  If you notice increased warmth, drainage, worsening pain I want you to be reevaluated.  I hope you have a great rest of your day!

## 2023-12-26 ENCOUNTER — Encounter: Admitting: Obstetrics and Gynecology

## 2023-12-28 NOTE — Lactation Note (Signed)
 This note was copied from a baby's chart.  NICU Lactation Consultation Note  Patient Name: Margaret Harvey Unijb'd Date: 12/28/2023 Age:23 wk.o.  Reason for consult: Weekly NICU follow-up; NICU baby; Late-preterm 34-36.6wks; Breast reduction; Infant < 6lbs; Exclusive pumping and bottle feeding  SUBJECTIVE Visited with family of 64 86/2 weeks old AGA NICU female Margaret Harvey; Margaret Harvey is a P1 and reported she's been pumping, she was finishing up her pumping session when entered the room, praised her for her efforts. She voiced that she has a Symphony and a wearable pump at home but that she doesn't really use them, she mainly pumps here while at the hospital. Noticed that pumping is not consistent and her supply is already dwindling. Reviewed strategies to increase supply and let her know that the more often she pumps the faster her body will make milk. Her plan is to exclusively pump and bottle feed only, her nipples are very sensitive due to breast reduction surgery from 2021. Baby Margaret Harvey working on PO feedings, she's taking about 50% or more by mouth.   OBJECTIVE Infant data: Mother's Current Feeding Choice: Breast Milk  O2 Device: Room Air  Infant feeding assessment IDFTS - Readiness: 2 IDFTS - Quality: 2   Maternal data: [id number] C-Section, Low Transverse Pumping frequency: 3-5 times/24 hours Pumped volume: 62 mL (up to 120 ml in the AM)  WIC Program: Yes WIC Referral Sent?: Yes What county?: [place] Pump: WIC Pump, Hands Free, Personal (Mom Cozy wearable)  ASSESSMENT Infant: Feeding Status: Scheduled 9-12-3-6 Feeding method: Bottle; Tube/Gavage (Bolus) Nipple Type: Nfant Extra Slow Flow (gold)  Maternal: Milk volume: Low  INTERVENTIONS/PLAN Interventions: Interventions: Breast feeding basics reviewed; Coconut oil; DEBP; Education  Plan: STS around care times Pump both breasts every 3 hours for 15-30 minutes, ideally 8 pumping sessions/24 hours Power pump once a  day Family will continue advancing on bottle feedings  No other support person at this time. All questions and concerns answered, family to contact [facility] services PRN.  Consult Status: NICU follow-up NICU Follow-up type: Weekly NICU follow up   Ziyad Dyar S Miriam 12/28/2023, 11:04 AM

## 2024-01-02 ENCOUNTER — Encounter: Admitting: Obstetrics and Gynecology

## 2024-01-09 ENCOUNTER — Ambulatory Visit: Admitting: Obstetrics and Gynecology

## 2024-01-09 ENCOUNTER — Encounter: Payer: Self-pay | Admitting: Obstetrics and Gynecology

## 2024-01-09 ENCOUNTER — Encounter: Admitting: Obstetrics and Gynecology

## 2024-01-09 MED ORDER — PROMETHAZINE HCL 25 MG PO TABS: 25.0000 mg | ORAL_TABLET | Freq: Four times a day (QID) | ORAL | 0 refills | Status: AC | PRN

## 2024-01-09 MED ORDER — AMLODIPINE BESYLATE 10 MG PO TABS
10.0000 mg | ORAL_TABLET | Freq: Every day | ORAL | 11 refills | Status: AC
Start: 2024-01-09 — End: ?

## 2024-01-09 NOTE — Patient Instructions (Signed)
 Icy hot patches/rubs  [employer]

## 2024-01-09 NOTE — Progress Notes (Signed)
 Post Partum Visit Note  Margaret Harvey is a 23 y.o. [phone] female who presents for a postpartum visit. She is 6 weeks postpartum following a primary cesarean section.  I have fully reviewed the prenatal and intrapartum course. The delivery was at 32 gestational weeks and 4 days.  Anesthesia: spinal. Postpartum course has been good. Baby is doing well. Baby came home on 8/25. Baby is feeding by breast & formula. Bleeding staining only. Bowel function is normal. Bladder function is normal. Patient is sexually active. Contraception method is none. Postpartum depression screening: negative.  Unprotected IC x 1 ~1-2 weeks ago Midback pain  The pregnancy intention screening data noted above was reviewed. Potential methods of contraception were discussed. The patient elected to proceed with No data recorded.   Edinburgh Postnatal Depression Scale - 01/09/24 1521       Edinburgh Postnatal Depression Scale:  In the Past 7 Days   I have been able to laugh and see the funny side of things. 0    I have looked forward with enjoyment to things. 0    I have blamed myself unnecessarily when things went wrong. 1    I have been anxious or worried for no good reason. 1    I have felt scared or panicky for no good reason. 1    Things have been getting on top of me. 1    I have been so unhappy that I have had difficulty sleeping. 1    I have felt sad or miserable. 1    I have been so unhappy that I have been crying. 1    The thought of harming myself has occurred to me. 0    Edinburgh Postnatal Depression Scale Total 7          Health Maintenance Due  Topic Date Due   Pneumococcal Vaccine (1 of 1 - PPSV23, PCV20, or PCV21) 01/30/2007   INFLUENZA VACCINE  12/14/2023   COVID-19 Vaccine (1 - 2024-25 season) Never done    The following portions of the patient's history were reviewed and updated as appropriate: allergies, current medications, past family history, past medical history, past social  history, past surgical history, and problem list.  Review of Systems Pertinent items are noted in HPI.  Objective:  BP 136/88   Pulse 81   Ht 5' 2 (1.575 m)   Wt 190 lb (86.2 kg)   LMP 01/04/2024 (Exact Date)   Breastfeeding Yes   BMI 34.75 kg/m    General:  alert, cooperative, and no distress   Breasts:  not indicated  Lungs: Normal effort  Heart:  Normal rate  Abdomen: Soft, non tender   Wound well approximated incision, well healed  GU exam:  not indicated       Assessment:   Postpartum exam - Discussed stretching and OTC options for back pain, follow up prn  Preeclampsia - Normotensive today, will continue amlodipine  10mg  daily and stop labetalol  - RN BP check in 2 weeks - PCP follow up for potential long term medication management  Anxiety/dep Continue lexapro   Plan:   Essential components of care per ACOG recommendations:  1.  Mood and well being: Patient with negative depression screening today. Reviewed local resources for support.  - Patient tobacco use? No.   - hx of drug use? No.    2. Infant care and feeding:  -Patient currently breastmilk feeding? Yes. Reviewed importance of draining breast regularly to support lactation.  -Social determinants of health (  SDOH) reviewed in EPIC. No concerns  3. Sexuality, contraception and birth spacing - Patient does not want a pregnancy in the next year.  Desired family size is 2-3 children.  - Reviewed reproductive life planning. Reviewed contraceptive methods based on pt preferences and effectiveness.  Patient desired Female Condom today.   - Discussed birth spacing of 18 months  4. Sleep and fatigue -Encouraged family/partner/community support of 4 hrs of uninterrupted sleep to help with mood and fatigue  5. Physical Recovery  - Discussed patients delivery and complications. She describes her labor as fine - wasn't as bad as she expected. Birth went well. - Patient had a C-section, no problems at delivery.  Patient expressed understanding - Patient has urinary incontinence? No. - Patient is safe to resume physical and sexual activity  6.  Health Maintenance - HM due items addressed Yes - Last pap smear 04/24/22 NILM -Breast Cancer screening indicated? No.   7. Chronic Disease/Pregnancy Condition follow up:  - PCP follow up for possible cHTN, mood d/o  Kieth JAYSON Carolin, MD [facility] [employer], [facility]

## 2024-01-18 ENCOUNTER — Ambulatory Visit: Admitting: Certified Nurse Midwife

## 2024-01-24 ENCOUNTER — Other Ambulatory Visit (HOSPITAL_COMMUNITY)
Admission: RE | Admit: 2024-01-24 | Discharge: 2024-01-24 | Disposition: A | Discharge status: Home / Self Care | Source: Ambulatory Visit | Attending: Obstetrics and Gynecology | Admitting: Obstetrics and Gynecology

## 2024-01-24 ENCOUNTER — Ambulatory Visit (INDEPENDENT_AMBULATORY_CARE_PROVIDER_SITE_OTHER)

## 2024-01-24 VITALS — BP 121/83 | HR 97 | Ht 62.0 in | Wt 202.0 lb

## 2024-01-24 DIAGNOSIS — Z113 Encounter for screening for infections with a predominantly sexual mode of transmission: Secondary | ICD-10-CM | POA: Insufficient documentation

## 2024-01-24 DIAGNOSIS — N898 Other specified noninflammatory disorders of vagina: Secondary | ICD-10-CM | POA: Diagnosis present

## 2024-01-24 NOTE — Progress Notes (Signed)
 Subjective:  Albertia Carvin is a 23 y.o. female here for BP check and self swab. *Pt reported did not take BP medication this morning as infant had an appointment.* Pt reported had been taking BP medication as scheduled. Pt also reported vaginal irritation x 2 days, denied any discharge or odor. Pt also requested STD testing.  Hypertension ROS: Patient denies any headaches, visual symptoms, RUQ/epigastric pain or other concerning symptoms.  Objective:  LMP 01/04/2024 (Exact Date)   Appearance alert, well appearing, and in no distress. General exam BP noted to be 121/83 today in office.    Assessment:   Blood Pressure well controlled and stable.   Plan:  Current treatment plan is effective, no change in therapy. RN encouraged patient to follow instructions for prescribed medications, continue monitoring symptoms, and to check blood pressures at home. Pt verbalized understanding.  GC, chlamydia, trichomonas, BVAG, CVAG probe. Treatment: To be determined once lab results are received ROV prn if symptoms persist or worsen.    Silvano LELON Piano, RN

## 2024-01-25 LAB — CERVICOVAGINAL ANCILLARY ONLY
Bacterial Vaginitis (gardnerella): POSITIVE — AB
Candida Glabrata: NEGATIVE
Candida Vaginitis: POSITIVE — AB
Chlamydia: NEGATIVE
Comment: NEGATIVE
Comment: NEGATIVE
Comment: NEGATIVE
Comment: NEGATIVE
Comment: NEGATIVE
Comment: NORMAL
Neisseria Gonorrhea: NEGATIVE
Trichomonas: NEGATIVE

## 2024-01-28 ENCOUNTER — Ambulatory Visit: Payer: Self-pay | Admitting: Obstetrics and Gynecology

## 2024-01-28 DIAGNOSIS — B3731 Acute candidiasis of vulva and vagina: Secondary | ICD-10-CM

## 2024-01-28 DIAGNOSIS — B9689 Other specified bacterial agents as the cause of diseases classified elsewhere: Secondary | ICD-10-CM

## 2024-01-28 MED ORDER — FLUCONAZOLE 150 MG PO TABS: 150.0000 mg | ORAL_TABLET | ORAL | 3 refills | Status: AC

## 2024-01-28 MED ORDER — METRONIDAZOLE 500 MG PO TABS: 500.0000 mg | ORAL_TABLET | Freq: Two times a day (BID) | ORAL | 0 refills | Status: AC
